# Patient Record
Sex: Female | Born: 1985
Health system: Southern US, Community
[De-identification: ages and names within clinical notes are randomized; demographics above are authoritative.]

## PROBLEM LIST (undated history)

## (undated) DIAGNOSIS — L500 Allergic urticaria: Secondary | ICD-10-CM

## (undated) DIAGNOSIS — I10 Essential (primary) hypertension: Secondary | ICD-10-CM

## (undated) DIAGNOSIS — F32A Depression, unspecified: Secondary | ICD-10-CM

## (undated) DIAGNOSIS — M797 Fibromyalgia: Secondary | ICD-10-CM

## (undated) DIAGNOSIS — F1010 Alcohol abuse, uncomplicated: Secondary | ICD-10-CM

## (undated) DIAGNOSIS — G43909 Migraine, unspecified, not intractable, without status migrainosus: Secondary | ICD-10-CM

## (undated) DIAGNOSIS — K219 Gastro-esophageal reflux disease without esophagitis: Secondary | ICD-10-CM

## (undated) DIAGNOSIS — R000 Tachycardia, unspecified: Secondary | ICD-10-CM

## (undated) DIAGNOSIS — Z87898 Personal history of other specified conditions: Secondary | ICD-10-CM

## (undated) DIAGNOSIS — F319 Bipolar disorder, unspecified: Secondary | ICD-10-CM

## (undated) DIAGNOSIS — F329 Major depressive disorder, single episode, unspecified: Secondary | ICD-10-CM

## (undated) DIAGNOSIS — F1190 Opioid use, unspecified, uncomplicated: Secondary | ICD-10-CM

## (undated) HISTORY — DX: Allergic urticaria: L50.0

## (undated) HISTORY — DX: Migraine, unspecified, not intractable, without status migrainosus: G43.909

## (undated) HISTORY — PX: CYSTECTOMY: SUR359

## (undated) HISTORY — DX: Fibromyalgia: M79.7

## (undated) HISTORY — DX: Depression, unspecified: F32.A

## (undated) HISTORY — DX: Essential (primary) hypertension: I10

## (undated) HISTORY — PX: MOLE REMOVAL: SHX2046

## (undated) HISTORY — DX: Tachycardia, unspecified: R00.0

## (undated) HISTORY — PX: LAPAROSCOPY: SHX197

## (undated) HISTORY — PX: TYMPANOSTOMY TUBE PLACEMENT: SHX32

## (undated) HISTORY — DX: Gastro-esophageal reflux disease without esophagitis: K21.9

---
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
## 2008-07-09 ENCOUNTER — Inpatient Hospital Stay (HOSPITAL_COMMUNITY): Admission: AD | Admit: 2008-07-09 | Discharge: 2008-07-09 | Payer: Self-pay | Admitting: Obstetrics and Gynecology

## 2008-07-16 ENCOUNTER — Encounter: Admission: RE | Admit: 2008-07-16 | Discharge: 2008-07-16 | Payer: Self-pay | Admitting: Obstetrics and Gynecology

## 2010-05-15 LAB — DIFFERENTIAL
Basophils Absolute: 0 10*3/uL (ref 0.0–0.1)
Basophils Relative: 0 % (ref 0–1)
Eosinophils Absolute: 0.1 10*3/uL (ref 0.0–0.7)
Eosinophils Relative: 2 % (ref 0–5)
Monocytes Absolute: 0.5 10*3/uL (ref 0.1–1.0)
Monocytes Relative: 10 % (ref 3–12)
Neutro Abs: 2.5 10*3/uL (ref 1.7–7.7)

## 2010-05-15 LAB — COMPREHENSIVE METABOLIC PANEL
ALT: 13 U/L (ref 0–35)
Albumin: 4.1 g/dL (ref 3.5–5.2)
Alkaline Phosphatase: 68 U/L (ref 39–117)
BUN: 9 mg/dL (ref 6–23)
Chloride: 105 mEq/L (ref 96–112)
Potassium: 3.8 mEq/L (ref 3.5–5.1)
Sodium: 137 mEq/L (ref 135–145)
Total Bilirubin: 0.3 mg/dL (ref 0.3–1.2)
Total Protein: 6.7 g/dL (ref 6.0–8.3)

## 2010-05-15 LAB — CBC
HCT: 36.5 % (ref 36.0–46.0)
Hemoglobin: 13.1 g/dL (ref 12.0–15.0)
Platelets: 234 10*3/uL (ref 150–400)
RDW: 12.5 % (ref 11.5–15.5)
WBC: 4.8 10*3/uL (ref 4.0–10.5)

## 2015-05-03 ENCOUNTER — Encounter: Payer: Self-pay | Admitting: Diagnostic Neuroimaging

## 2015-05-03 ENCOUNTER — Ambulatory Visit (INDEPENDENT_AMBULATORY_CARE_PROVIDER_SITE_OTHER): Payer: Medicaid Other | Admitting: Diagnostic Neuroimaging

## 2015-05-03 VITALS — BP 110/77 | HR 101 | Ht 60.0 in | Wt 118.4 lb

## 2015-05-03 DIAGNOSIS — M542 Cervicalgia: Secondary | ICD-10-CM | POA: Diagnosis not present

## 2015-05-03 DIAGNOSIS — R200 Anesthesia of skin: Secondary | ICD-10-CM

## 2015-05-03 DIAGNOSIS — R208 Other disturbances of skin sensation: Secondary | ICD-10-CM

## 2015-05-03 DIAGNOSIS — M79642 Pain in left hand: Secondary | ICD-10-CM

## 2015-05-03 DIAGNOSIS — M79641 Pain in right hand: Secondary | ICD-10-CM | POA: Diagnosis not present

## 2015-05-03 NOTE — Progress Notes (Signed)
[facility]  PATIENT: Madison Ryan DOB: March 03, 1985  REFERRING CLINICIAN: Osei-Bonsu HISTORY FROM: patient  REASON FOR VISIT: new consult    HISTORICAL  CHIEF COMPLAINT:  No chief complaint on file.   HISTORY OF PRESENT ILLNESS:   30 year old right-handed female with osteopenia, Raynaud's phenomenon, restless leg syndrome, GERD, gastric ulcers, thoracic compression fractures, endometriosis, migraine, depression, anxiety, fibromyalgia, here for evaluation of upper extremity numbness, tingling and weakness.  For past 2-3 months patient has had intermittent numbness and tingling pain on a daily basis, multiple times a day, 30-60 minutes a time, from her elbows down to her fingers. Digits 4 and 5 are mainly affected on both sides. Symptoms can switch sides. Symptoms seem to be worsened patient's arms are bent. Symptoms also worse when she is lying down.  Patient has also had consultation of other symptoms including numbness and tingling in the toes and fingers for past 2 years. She is also had diffuse pain in her neck, shoulders, arms, legs, back, spine since 2013. Patient was living in [place] at the time. She had extensive testing through her PCP, holistic doctor and then also a neurologist in [place] in 2015. Patient currently working with pain management doctor and psychiatrist for other issues as well.     REVIEW OF SYSTEMS: Full 14 system review of systems performed and negative with exception of: Insomnia sleepiness restless legs memory loss confusion headache numbness weakness dizziness tremor depression anxiety to much sleep disinterest in activities racing thoughts allergy skin sensitivity joint pain joint swelling cramps and diarrhea constipation from his of breath loss of vision eye pain and large lymph nodes fatigue chest pain palpitation swelling in legs rash birthmarks ringing in ears.  ALLERGIES: Allergies  Allergen Reactions  .  Cymbalta [Duloxetine Hcl] Other (See Comments)    Suicidal thoughts  . Prozac [Fluoxetine Hcl] Other (See Comments)    High anxiety, panic attacks    HOME MEDICATIONS: No outpatient prescriptions prior to visit.   No facility-administered medications prior to visit.    PAST MEDICAL HISTORY: Past Medical History  Diagnosis Date  . Tachycardia     PAST SURGICAL HISTORY: History reviewed. No pertinent past surgical history.  FAMILY HISTORY: Family History  Problem Relation Age of Onset  . Hypertension Mother   . Cancer Mother     breast  . Heart disease Mother   . COPD Mother   . Liver disease Mother   . Hyperthyroidism Sister   . Hypertension Maternal Grandmother   . Heart disease Maternal Grandmother   . COPD Maternal Grandmother     SOCIAL HISTORY:  Social History   Social History  . Marital Status: Single    Spouse Name: N/A  . Number of Children: 1  . Years of Education: 14   Occupational History  .      unemployed   Social History Main Topics  . Smoking status: Current Every Day Smoker -- 1.00 packs/day for 12 years  . Smokeless tobacco: Not on file  . Alcohol Use: Yes     Comment: 1 month maybe  . Drug Use: No  . Sexual Activity: Not on file   Other Topics Concern  . Not on file   Social History Narrative   Lives with sister , her family, son   Caffeine use- sodas, 1-2 daily     PHYSICAL EXAM  GENERAL EXAM/CONSTITUTIONAL: Vitals:  Filed Vitals:   05/03/15 1142  BP: 110/77  Pulse: 101  Height:  5' (1.524 m)  Weight: 118 lb 6.4 oz (53.706 kg)     Body mass index is 23.12 kg/(m^2).  Visual Acuity Screening   Right eye Left eye Both eyes  Without correction:     With correction: 20/30 20/30      Patient is in no distress; well developed, nourished and groomed; neck is supple  MALAISE APPEARANCE; TEARFUL  CARDIOVASCULAR:  Examination of carotid arteries is normal; no carotid bruits  Regular rate and rhythm, no  murmurs  Examination of peripheral vascular system by observation and palpation is normal  EYES:  Ophthalmoscopic exam of optic discs and posterior segments is normal; no papilledema or hemorrhages  MUSCULOSKELETAL:  Gait, strength, tone, movements noted in Neurologic exam below  NEUROLOGIC: MENTAL STATUS:  No flowsheet data found.  awake, alert, oriented to person, place and time  recent and remote memory intact  normal attention and concentration  language fluent, comprehension intact, naming intact,   fund of knowledge appropriate  CRANIAL NERVE:   2nd - no papilledema on fundoscopic exam  2nd, 3rd, 4th, 6th - pupils equal and reactive to light, visual fields full to confrontation, extraocular muscles intact, no nystagmus  5th - facial sensation symmetric  7th - facial strength symmetric  8th - hearing intact  9th - palate elevates symmetrically, uvula midline  11th - shoulder shrug symmetric  12th - tongue protrusion midline  MOTOR:   normal bulk and tone, DIFFUSE LIMITED STRENGTH 3-4 IN BUE AND BLE  SENSORY:   normal and symmetric to light touch, temperature, vibration  COORDINATION:   finger-nose-finger, fine finger movements normal  REFLEXES:   deep tendon reflexes TRACE and symmetric  GAIT/STATION:   narrow based gait; SLOW, ANTALGIC GAIT AND MOVEMENTS    DIAGNOSTIC DATA (LABS, IMAGING, TESTING) - I reviewed patient records, labs, notes, testing and imaging myself where available.  Lab Results  Component Value Date   WBC 4.8 07/09/2008   HGB 13.1 07/09/2008   HCT 36.5 07/09/2008   MCV 91.8 07/09/2008   PLT 234 07/09/2008      Component Value Date/Time   NA 137 07/09/2008 1809   K 3.8 07/09/2008 1809   CL 105 07/09/2008 1809   CO2 27 07/09/2008 1809   GLUCOSE 81 07/09/2008 1809   BUN 9 07/09/2008 1809   CREATININE 0.63 07/09/2008 1809   CALCIUM 9.2 07/09/2008 1809   PROT 6.7 07/09/2008 1809   ALBUMIN 4.1 07/09/2008 1809    AST 18 07/09/2008 1809   ALT 13 07/09/2008 1809   ALKPHOS 68 07/09/2008 1809   BILITOT 0.3 07/09/2008 1809   GFRNONAA >60 07/09/2008 1809   GFRAA  07/09/2008 1809    >60        The eGFR has been calculated using the MDRD equation. This calculation has not been validated in all clinical situations. eGFR's persistently <60 mL/min signify possible Chronic Kidney Disease.   No results found for: CHOL, HDL, LDLCALC, LDLDIRECT, TRIG, CHOLHDL No results found for: HGBA1C No results found for: VITAMINB12 No results found for: TSH   09/21/14 u/s abdomen - Surgical absent gallbladder.  Normal CBD.  Unremarkable liver.     ASSESSMENT AND PLAN  30 y.o. year old female here with constellation of symptoms including diffuse pain, diffuse weakness, now with numbness and tingling in upper extremities for past to 3 months. We'll check MRI brain, cervical spine. We'll also check EMG nerve conduction study. Patient also has chronic pain symptoms and chronic mood disorder issues.  Ddx:  CNS autoimmune/inflamm, metabolic, pain syndrome, mood disorder, peripheral neuropathy, cervical radiculopathy  1. Bilateral hand pain   2. Hand numbness   3. Neck pain      PLAN:  Orders Placed This Encounter  Procedures  . MR Brain W Wo Contrast  . MR Cervical Spine W Wo Contrast  . NCV with EMG(electromyography)   Return for for NCV/EMG.    Penni Bombard, MD 3/81/0175, 10:25 PM Certified in Neurology, Neurophysiology and Neuroimaging  [facility] [address] [place], [place] [address] [phone]

## 2015-05-03 NOTE — Patient Instructions (Signed)
Thank you for coming to see [place] at [facility]. I hope we have been able to provide you high quality care today.  You may receive a patient satisfaction survey over the next few weeks. We would appreciate your feedback and comments so that we may continue to improve ourselves and the health of our patients.  - I will check MRI brain and cervical spine - I will check EMG/NCS (electrical nerve testing)   ~~~~~~~~~~~~~~~~~~~~~~~~~~~~~~~~~~~~~~~~~~~~~~~~~~~~~~~~~~~~~~~~~  DR. Bionca Mckey'S GUIDE TO HAPPY AND HEALTHY LIVING These are some of my general health and wellness recommendations. Some of them may apply to you better than others. Please use common sense as you try these suggestions and feel free to ask me any questions.   ACTIVITY/FITNESS Mental, social, emotional and physical stimulation are very important for brain and body health. Try learning a new activity (arts, music, language, sports, games).  Keep moving your body to the best of your abilities. You can do this at home, inside or outside, the park, community center, gym or anywhere you like. Consider a physical therapist or personal trainer to get started. Consider the app Sworkit. Fitness trackers such as smart-watches, smart-phones or Fitbits can help as well.   NUTRITION Eat more plants: colorful vegetables, nuts, seeds and berries.  Eat less sugar, salt, preservatives and processed foods.  Avoid toxins such as cigarettes and alcohol.  Drink water when you are thirsty. Warm water with a slice of lemon is an excellent morning drink to start the day.  Consider these websites for more information The Nutrition Source (https://www.henry-hernandez.biz/) Precision Nutrition (WindowBlog.ch)   RELAXATION Consider practicing mindfulness meditation or other relaxation techniques such as deep breathing, prayer, yoga, tai chi, massage. See website mindful.org or the apps  Headspace or Calm to help get started.   SLEEP Try to get at least 7-8+ hours sleep per day. Regular exercise and reduced caffeine will help you sleep better. Practice good sleep hygeine techniques. See website sleep.org for more information.   PLANNING Prepare estate planning, living will, healthcare POA documents. Sometimes this is best planned with the help of an attorney. Theconversationproject.org and agingwithdignity.org are excellent resources.

## 2015-05-09 ENCOUNTER — Telehealth: Payer: Self-pay | Admitting: Diagnostic Neuroimaging

## 2015-05-09 ENCOUNTER — Inpatient Hospital Stay: Admission: RE | Admit: 2015-05-09 | Payer: Self-pay | Source: Ambulatory Visit

## 2015-05-09 ENCOUNTER — Other Ambulatory Visit: Payer: Self-pay

## 2015-05-09 NOTE — Telephone Encounter (Signed)
Patient is calling to see if her insurance Medicaid has approved her cervical spine and brain MRIs. Call transferred to [facility].

## 2015-05-12 NOTE — Telephone Encounter (Signed)
-----   Message from Blenda Peals sent at 05/09/2015  4:26 PM EDT ----- Regarding: MRI BRAIN & C-SPINE  Dr. Leta Baptist,  Did you receive the email I forward to you last Friday from [place] in regards to [employer] needing more information to approve this request?  The patient is scheduled to have her scans done this Friday @ [facility].  She has called 4 times since last Friday in regards to her auth. She states that her symptoms are getting worse.  You can call [employer]# [id number]  [id number]   Please advise with auth or on how to proceed.  Thank you!

## 2015-05-12 NOTE — Telephone Encounter (Signed)
I called [employer] for peer-peer review. Exams are approved.   Auth number: [id number]  -VRP

## 2015-05-12 NOTE — Telephone Encounter (Signed)
Pt called to speak with Seth Bake. I advised her Seth Bake was out of the office until Monday. She asked that a message be sent to Dr Leta Baptist. She said Dr Leta Baptist has to call the insurance company ,she doesn't know why but that is what she has been told by Seth Bake and GI. Sts her appt for MRI is tomorrow 05/13/15 at 5pm and this has not been authorized. Pt sts she has called everyday this week. Please call pt

## 2015-05-13 ENCOUNTER — Ambulatory Visit
Admission: RE | Admit: 2015-05-13 | Discharge: 2015-05-13 | Disposition: A | Discharge status: Home / Self Care | Payer: Self-pay | Source: Ambulatory Visit | Attending: Diagnostic Neuroimaging | Admitting: Diagnostic Neuroimaging

## 2015-05-13 ENCOUNTER — Ambulatory Visit
Admission: RE | Admit: 2015-05-13 | Discharge: 2015-05-13 | Disposition: A | Discharge status: Home / Self Care | Payer: Medicaid Other | Source: Ambulatory Visit | Attending: Diagnostic Neuroimaging | Admitting: Diagnostic Neuroimaging

## 2015-05-13 DIAGNOSIS — R208 Other disturbances of skin sensation: Secondary | ICD-10-CM

## 2015-05-13 DIAGNOSIS — M79642 Pain in left hand: Principal | ICD-10-CM

## 2015-05-13 DIAGNOSIS — R200 Anesthesia of skin: Secondary | ICD-10-CM

## 2015-05-13 DIAGNOSIS — M542 Cervicalgia: Secondary | ICD-10-CM | POA: Diagnosis not present

## 2015-05-13 DIAGNOSIS — M79641 Pain in right hand: Secondary | ICD-10-CM | POA: Diagnosis not present

## 2015-05-13 MED ORDER — GADOBENATE DIMEGLUMINE 529 MG/ML IV SOLN
10.0000 mL | Freq: Once | INTRAVENOUS | Status: AC | PRN
  Administered 2015-05-13: 10 mL via INTRAVENOUS

## 2015-05-13 NOTE — Telephone Encounter (Signed)
LVM informing patient her MRIs are approved, number put into appointments. Left name and number for questions; advised she must call before 12 noon today to reach RN.

## 2015-05-16 ENCOUNTER — Telehealth: Payer: Self-pay | Admitting: *Deleted

## 2015-05-16 NOTE — Telephone Encounter (Signed)
Per Dr Leta Baptist, spoke with patient and informed her that her MRI brain showed a few spots of scar tissue; her C spine is normal. Reminded her of EMG/NCS on 06/02/15. She verbalized understanding, appreciation.

## 2015-05-24 ENCOUNTER — Encounter: Payer: Medicaid Other | Admitting: Diagnostic Neuroimaging

## 2015-06-02 ENCOUNTER — Ambulatory Visit (INDEPENDENT_AMBULATORY_CARE_PROVIDER_SITE_OTHER): Payer: Medicaid Other | Admitting: Diagnostic Neuroimaging

## 2015-06-02 ENCOUNTER — Encounter (INDEPENDENT_AMBULATORY_CARE_PROVIDER_SITE_OTHER): Payer: Self-pay | Admitting: Diagnostic Neuroimaging

## 2015-06-02 ENCOUNTER — Encounter: Payer: Self-pay | Admitting: Diagnostic Neuroimaging

## 2015-06-02 DIAGNOSIS — M542 Cervicalgia: Secondary | ICD-10-CM

## 2015-06-02 DIAGNOSIS — M79641 Pain in right hand: Secondary | ICD-10-CM | POA: Diagnosis not present

## 2015-06-02 DIAGNOSIS — Z0289 Encounter for other administrative examinations: Secondary | ICD-10-CM

## 2015-06-02 DIAGNOSIS — M79642 Pain in left hand: Secondary | ICD-10-CM | POA: Diagnosis not present

## 2015-06-02 DIAGNOSIS — R200 Anesthesia of skin: Secondary | ICD-10-CM

## 2015-06-02 MED ORDER — RIZATRIPTAN BENZOATE 10 MG PO TBDP: 10.0000 mg | ORAL_TABLET | ORAL | Status: DC | PRN

## 2015-06-02 NOTE — Procedures (Signed)
   [facility]  NCS (NERVE CONDUCTION STUDY) WITH EMG (ELECTROMYOGRAPHY) REPORT   STUDY DATE: 06/02/15 PATIENT NAME: [name] DOB: 29-Dec-1985 MRN: [id number]  ORDERING CLINICIAN: Andrey Spearman, MD   TECHNOLOGIST: Laretta Alstrom  ELECTROMYOGRAPHER: Earlean Polka. Terreon Ekholm, MD  CLINICAL INFORMATION: 30 year old female with upper extremity pain and numbness.  FINDINGS: NERVE CONDUCTION STUDY: Bilateral median and ulnar motor responses and F wave latencies are normal. Bilateral median and ulnar sensory responses are normal.  NEEDLE ELECTROMYOGRAPHY: Needle examination of right upper extremity deltoid, biceps, flexor carpi radialis muscles is normal.   IMPRESSION:  This is a normal study. No electrodiagnostic evidence of large fiber neuropathy or myopathy at this time.    INTERPRETING PHYSICIAN:  Penni Bombard, MD Certified in Neurology, Neurophysiology and Neuroimaging  [facility] [address] [place], [place] [address] [phone]

## 2015-06-03 ENCOUNTER — Telehealth: Payer: Self-pay | Admitting: Diagnostic Neuroimaging

## 2015-06-03 NOTE — Telephone Encounter (Signed)
Patient called to advise insurance needs prior approval for migraine medication, doesn't remember the name of it.

## 2015-06-06 NOTE — Telephone Encounter (Signed)
Spoke with patient and informed her that this RN didn't have any information in Dr [employer] office note regarding her migraines. She stated she has tried, failed Imitrex, but she could not recall any other migraine meds she has tried/failed. Informed her that this may not be enough information to get PA, but this RN will call her insurance. Patient sounded tearful,  stating she has so many health issues and can't get her other medications either. She inquired as to why her MRI results wouldn't "be enough" to get her the medication. She stated she is very frustrated. This RN assured her she will call and do her best to get PA for medication, and will call her back and let her know outcome. Patient apologized for her frustration and verbalized understanding.  Spoke with [place], Medicaid, [place], [phone], who stated that patient picked up [place] 9 tabs on 05/13/15, therefore she will only be able to get 3 more tabs until 30 day time limit is up which is on 06/13/15.  Medicaid allows up to 12 tabs per 30 days of triptans.  She stated that Dr Gladstone Lighter prescription quantity does not require PA, but because patient has already received 9 tabs, she may only get 3 more until 06/13/15. At that time she may get full refill quantity.  Called patient and informed her of above conversation with [place], [employer].  She repeated information correctly, apologized again and verbalized understanding and appreciation for assistance.

## 2015-08-02 ENCOUNTER — Encounter: Payer: Self-pay | Admitting: Diagnostic Neuroimaging

## 2015-08-02 ENCOUNTER — Ambulatory Visit: Payer: Medicaid Other | Admitting: Diagnostic Neuroimaging

## 2015-12-15 ENCOUNTER — Telehealth: Payer: Self-pay | Admitting: *Deleted

## 2015-12-15 NOTE — Telephone Encounter (Signed)
Received PA request for rizatriptan on cover my meds. Called [facility]; spoke with Tiffany who gave pending PA # [id number].  She stated pt will not be able to get med until PA approved as pharmacy is submitting claim.  She stated that pt should be able to get refill of 9 tabs/month without a PA, so she advised this RN call pharmacy.  Called [facility], spoke with Mickel Baas who stated patient told pharmacy she "accidentally threw away her rizatriptan", so she  asked for another refill. Mickel Baas stated she is requesting a refill 18 days too early. Madison Ryan the patient was a 'no show' for her FU, last seen April. Madison Ryan to have patient call office and schedule follow up.  Mickel Baas verbalized understanding.

## 2016-03-14 ENCOUNTER — Ambulatory Visit: Payer: Medicaid Other | Admitting: Allergy and Immunology

## 2016-03-28 ENCOUNTER — Ambulatory Visit: Payer: Medicaid Other | Admitting: Allergy and Immunology

## 2016-04-05 ENCOUNTER — Encounter: Payer: Self-pay | Admitting: Allergy and Immunology

## 2016-04-05 ENCOUNTER — Ambulatory Visit (INDEPENDENT_AMBULATORY_CARE_PROVIDER_SITE_OTHER): Payer: Medicaid Other | Admitting: Allergy and Immunology

## 2016-04-05 VITALS — BP 104/88 | HR 100 | Temp 98.1°F | Resp 20 | Ht 61.0 in | Wt 101.6 lb

## 2016-04-05 DIAGNOSIS — R0600 Dyspnea, unspecified: Secondary | ICD-10-CM | POA: Diagnosis not present

## 2016-04-05 DIAGNOSIS — L500 Allergic urticaria: Secondary | ICD-10-CM | POA: Diagnosis not present

## 2016-04-05 DIAGNOSIS — J3089 Other allergic rhinitis: Secondary | ICD-10-CM

## 2016-04-05 HISTORY — DX: Allergic urticaria: L50.0

## 2016-04-05 MED ORDER — ALBUTEROL SULFATE HFA 108 (90 BASE) MCG/ACT IN AERS: 2.0000 | INHALATION_SPRAY | Freq: Four times a day (QID) | RESPIRATORY_TRACT | 1 refills | Status: DC | PRN

## 2016-04-05 MED ORDER — FLUTICASONE PROPIONATE 50 MCG/ACT NA SUSP: 2.0000 | Freq: Every day | NASAL | 5 refills | Status: AC

## 2016-04-05 MED ORDER — LEVOCETIRIZINE DIHYDROCHLORIDE 5 MG PO TABS: 5.0000 mg | ORAL_TABLET | Freq: Every evening | ORAL | 5 refills | Status: DC

## 2016-04-05 NOTE — Assessment & Plan Note (Signed)
   Aeroallergen avoidance measures have been discussed and provided in written form.  A prescription has been provided for levocetirizine (as above).  A prescription has been provided for fluticasone nasal spray, 2 sprays per nostril daily as needed. Proper nasal spray technique has been discussed and demonstrated.  I have also recommended nasal saline spray (i.e. Simply Saline) as needed prior to medicated nasal sprays.

## 2016-04-05 NOTE — Assessment & Plan Note (Addendum)
Unclear etiology. Skin tests to select food allergens were negative except for borderline positive results to waste her lobster.  However, the patient reports that she is able to eat shellfish without adverse symptoms and her symptoms do not correlate with the consumption of shellfish, therefore these represent false positive results.  NSAIDs and emotional stress commonly exacerbate urticaria but are not the underlying etiology in this case. There are no concomitant symptoms concerning for anaphylaxis or constitutional symptoms worrisome for an underlying malignancy. We will rule out other potential etiologies with labs. For symptom relief, patient is to take oral antihistamines as directed.  The following labs have been ordered: FCeRI antibody, TSH, anti-thyroglobulin antibody, thyroid peroxidase antibody, tryptase, CBC, CMP, ESR, ANA, and galactose-alpha-1,3-galactose IgE level.  The patient will be called with further recommendations after lab results have returned.  Instructions have been discussed and provided for H1/H2 receptor blockade with titration to find lowest effective dose.  A prescription has been provided for levocetirizine, 76m daily as needed.  A journal is to be kept recording any foods eaten, beverages consumed, medications taken within a 6 hour period prior to the onset of symptoms, as well as record activities being performed, and environmental conditions. For any symptoms concerning for anaphylaxis, 911 is to be called immediately.

## 2016-04-05 NOTE — Progress Notes (Addendum)
New Patient Note  RE: Madison Ryan MRN: [id number] DOB: 1985-09-16 Date of Office Visit: 04/05/2016  Referring provider: Benito Mccreedy, MD Primary care provider: Trey Sailors, PA  Chief Complaint: Allergic Reaction and Urticaria   History of present illness: Madison Ryan is a 31 y.o. female seen today in consultation requested by Benito Mccreedy, MD.  Over the past 4 months, Madison Ryan has experienced recurrent episodes of hives. The hives have appeared at different times over her head, neck, chest, back, arms and legs.  The lesions are described as erythematous, raised, at times linear, and pruritic.  Individual hives last less than 24 hours without leaving residual pigmentation or bruising. She denies concomitant angioedema, cardiopulmonary symptoms and GI symptoms. She has not experienced unexpected weight loss, recurrent fevers or drenching night sweats. She does admit to frequent joint pain which she attributes to fibromyalgia. No specific medication, food, skin care product, detergent, soap, or other environmental triggers have been identified. The symptoms do not seem to correlate with NSAIDs use or emotional stress. She did have clostridium difficile at the time of symptom onset. Madison Ryan has tried to control symptoms with systemic steroids and OTC antihistamines which have offered mild temporary relief of symptoms. She has not been evaluated and treated in the emergency department for these symptoms. Skin biopsy has not been performed. Recently, she has been experiencing hives almost daily.  Madison Ryan states that when she is exposed to very hot weather very cold weather she experiences dyspnea and chest tightness.  The lower respiratory symptoms seem to resolve once she is in an environment more moderate temperature.  She experiences nasal congestion, rhinorrhea, sneezing, and postnasal drainage. No significant seasonal symptom variation has been noted nor have specific environmental triggers been  identified.   Assessment and plan: Recurrent urticaria Unclear etiology. Skin tests to select food allergens were negative except for borderline positive results to waste her lobster.  However, the patient reports that she is able to eat shellfish without adverse symptoms and her symptoms do not correlate with the consumption of shellfish, therefore these represent false positive results.  NSAIDs and emotional stress commonly exacerbate urticaria but are not the underlying etiology in this case. There are no concomitant symptoms concerning for anaphylaxis or constitutional symptoms worrisome for an underlying malignancy. We will rule out other potential etiologies with labs. For symptom relief, patient is to take oral antihistamines as directed.  The following labs have been ordered: FCeRI antibody, TSH, anti-thyroglobulin antibody, thyroid peroxidase antibody, tryptase, CBC, CMP, ESR, ANA, and galactose-alpha-1,3-galactose IgE level.  The patient will be called with further recommendations after lab results have returned.  Instructions have been discussed and provided for H1/H2 receptor blockade with titration to find lowest effective dose.  A prescription has been provided for levocetirizine, 82m daily as needed.  A journal is to be kept recording any foods eaten, beverages consumed, medications taken within a 6 hour period prior to the onset of symptoms, as well as record activities being performed, and environmental conditions. For any symptoms concerning for anaphylaxis, 911 is to be called immediately.  Dyspnea  Tobacco cessation has been encouraged.  A prescription has been provided for albuterol HFA, 1-2 inhalations every 4-6 hours as needed.  Subjective and objective measures of pulmonary function will be followed and the treatment plan will be adjusted accordingly.  Other allergic rhinitis  Aeroallergen avoidance measures have been discussed and provided in written form.  A  prescription has been provided for levocetirizine (as above).  A prescription has been provided for fluticasone nasal spray, 2 sprays per nostril daily as needed. Proper nasal spray technique has been discussed and demonstrated.  I have also recommended nasal saline spray (i.e. Simply Saline) as needed prior to medicated nasal sprays.   Meds ordered this encounter  Medications  . levocetirizine (XYZAL) 5 MG tablet    Sig: Take 1 tablet (5 mg total) by mouth every evening.    Dispense:  34 tablet    Refill:  5  . albuterol (PROVENTIL HFA;VENTOLIN HFA) 108 (90 Base) MCG/ACT inhaler    Sig: Inhale 2 puffs into the lungs every 6 (six) hours as needed for wheezing or shortness of breath.    Dispense:  1 Inhaler    Refill:  1  . fluticasone (FLONASE) 50 MCG/ACT nasal spray    Sig: Place 2 sprays into both nostrils daily.    Dispense:  16 g    Refill:  5    Diagnostics: Spirometry:  Normal with an FEV1 of 3.52 L.  Please see scanned spirometry results for details. Environmental skin testing: Positive to molds and cockroach antigen. Food allergen skin testing: Borderline positive to waste her lobster.    Physical examination: Blood pressure 104/88, pulse 100, temperature 98.1 F (36.7 C), temperature source Oral, resp. rate 20, height '5\' 1"'  (1.549 m), weight 101 lb 10.1 oz (46.1 kg), SpO2 96 %.  General: Alert, interactive, in no acute distress. HEENT: TMs pearly gray, turbinates mildly edematous without discharge, post-pharynx mildly erythematous. Neck: Supple without lymphadenopathy. Lungs: Clear to auscultation without wheezing, rhonchi or rales. CV: Normal S1, S2 without murmurs. Abdomen: Nondistended, nontender. Skin: Scattered erythematous urticarial type lesions primarily located upper chest, neck , nonvesicular. Extremities:  No clubbing, cyanosis or edema. Neuro:   Grossly intact.  Review of systems:  Review of systems negative except as noted in HPI / PMHx or noted  below: Review of Systems  Constitutional: Negative.   HENT: Negative.   Eyes: Negative.   Respiratory: Negative.   Cardiovascular: Negative.   Gastrointestinal: Negative.   Genitourinary: Negative.   Musculoskeletal: Negative.   Skin: Negative.   Neurological: Negative.   Endo/Heme/Allergies: Negative.   Psychiatric/Behavioral: Negative.     Past medical history:  Past Medical History:  Diagnosis Date  . Recurrent urticaria 04/05/2016  . Tachycardia     Past surgical history:  Past Surgical History:  Procedure Laterality Date  . CYSTECTOMY    . LAPAROSCOPY    . MOLE REMOVAL    . TYMPANOSTOMY TUBE PLACEMENT      Family history: Family History  Problem Relation Age of Onset  . Hypertension Mother   . Cancer Mother     breast  . Heart disease Mother   . COPD Mother   . Liver disease Mother   . Hyperthyroidism Sister   . Hypertension Maternal Grandmother   . Heart disease Maternal Grandmother   . COPD Maternal Grandmother     Social history: Social History   Social History  . Marital status: Single    Spouse name: N/A  . Number of children: 1  . Years of education: 70   Occupational History  .      unemployed   Social History Main Topics  . Smoking status: Current Every Day Smoker    Packs/day: 1.00    Years: 12.00  . Smokeless tobacco: Never Used  . Alcohol use Yes     Comment: 1 month maybe  . Drug use: No  .  Sexual activity: Not on file   Other Topics Concern  . Not on file   Social History Narrative   Lives with sister , her family, son   Caffeine use- sodas, 1-2 daily   Environmental History: The patient lives in a house built in the [place] with vinyl floors throughout, gas heat, and window air conditioning units.  She has no pets.  She smokes one pack of cigarettes per day on average and started in 2004.  Allergies as of 04/05/2016      Reactions   Cymbalta [duloxetine Hcl] Other (See Comments)   Suicidal thoughts, pt. Is currently taking  it   Prozac [fluoxetine Hcl] Other (See Comments)   High anxiety, panic attacks      Medication List       Accurate as of 04/05/16  5:56 PM. Always use your most recent med list.          Calcium Carb-Ergocalciferol 500-200 MG-UNIT Tabs Take by mouth.   Calcium Carb-Ergocalciferol 500-200 MG-UNIT Tabs Take by mouth.   clonazePAM 0.5 MG tablet Commonly known as:  KLONOPIN 1 mg daily. As needed   fluticasone 50 MCG/ACT nasal spray Commonly known as:  FLONASE Place 2 sprays into both nostrils daily.   HYDROcodone-acetaminophen 5-325 MG tablet Commonly known as:  NORCO/VICODIN   HYDROcodone-acetaminophen 10-325 MG tablet Commonly known as:  NORCO TAKE 1 TABLET 3 TIMES A DAY AS NEEDED   levocetirizine 5 MG tablet Commonly known as:  XYZAL Take 1 tablet (5 mg total) by mouth every evening.   lubiprostone 24 MCG capsule Commonly known as:  AMITIZA TAKE ONE CAPSULE BY MOUTH TWICE A DAY AS NEEDED FOR 30 DAYS   LYRICA 200 MG capsule Generic drug:  pregabalin Take 200 mg by mouth 3 (three) times daily.   mometasone 50 MCG/ACT nasal spray Commonly known as:  NASONEX USE 2 SPRAY IN EACH NOSTRIL ONCE DAILY   montelukast 10 MG tablet Commonly known as:  SINGULAIR Take 10 mg by mouth.   norgestrel-ethinyl estradiol 0.3-30 MG-MCG tablet Commonly known as:  LO/OVRAL,CRYSELLE Take by mouth.   Potassium 99 MG Tabs Take 99 mg by mouth.   PRISTIQ 50 MG 24 hr tablet Generic drug:  desvenlafaxine Take 50 mg by mouth. Reported on 05/03/2015   PROAIR HFA 108 (90 Base) MCG/ACT inhaler Generic drug:  albuterol INHALE 2 PUFFS 4 TIMES DAILY   albuterol 108 (90 Base) MCG/ACT inhaler Commonly known as:  PROVENTIL HFA;VENTOLIN HFA Inhale 2 puffs into the lungs every 6 (six) hours as needed for wheezing or shortness of breath.   progesterone 100 MG capsule Commonly known as:  PROMETRIUM Take 100 mg by mouth.   propranolol 20 MG tablet Commonly known as:  INDERAL Take 20 mg  by mouth. Takes 10 mg twice daily   Riboflavin 400 MG Tabs Take by mouth.   rizatriptan 10 MG disintegrating tablet Commonly known as:  MAXALT-MLT Take 1 tablet (10 mg total) by mouth as needed for migraine. May repeat in 2 hours if needed   SUMAtriptan 100 MG tablet Commonly known as:  IMITREX 100 mg. Take 1 tab at onset of headache, may repeat one time in 2 hrs if needed, MAX dose 2 tabs in 24 hrs   tiZANidine 2 MG tablet Commonly known as:  ZANAFLEX Take 2 mg by mouth.       Known medication allergies: Allergies  Allergen Reactions  . Cymbalta [Duloxetine Hcl] Other (See Comments)    Suicidal thoughts, pt. Is  currently taking it  . Prozac [Fluoxetine Hcl] Other (See Comments)    High anxiety, panic attacks    I appreciate the opportunity to take part in Madison Ryan's care. Please do not hesitate to contact me with questions.  Sincerely,   R. Edgar Frisk, MD

## 2016-04-05 NOTE — Assessment & Plan Note (Signed)
   Tobacco cessation has been encouraged.  A prescription has been provided for albuterol HFA, 1-2 inhalations every 4-6 hours as needed.  Subjective and objective measures of pulmonary function will be followed and the treatment plan will be adjusted accordingly.

## 2016-04-05 NOTE — Patient Instructions (Addendum)
Recurrent urticaria Unclear etiology. Skin tests to select food allergens were negative except for borderline positive results to waste her lobster.  However, the patient reports that she is able to eat shellfish without adverse symptoms and her symptoms do not correlate with the consumption of shellfish, therefore these represent false positive results.  NSAIDs and emotional stress commonly exacerbate urticaria but are not the underlying etiology in this case. There are no concomitant symptoms concerning for anaphylaxis or constitutional symptoms worrisome for an underlying malignancy. We will rule out other potential etiologies with labs. For symptom relief, patient is to take oral antihistamines as directed.  The following labs have been ordered: FCeRI antibody, TSH, anti-thyroglobulin antibody, thyroid peroxidase antibody, tryptase, CBC, CMP, ESR, ANA, and galactose-alpha-1,3-galactose IgE level.  The patient will be called with further recommendations after lab results have returned.  Instructions have been discussed and provided for H1/H2 receptor blockade with titration to find lowest effective dose.  A prescription has been provided for levocetirizine, 71m daily as needed.  A journal is to be kept recording any foods eaten, beverages consumed, medications taken within a 6 hour period prior to the onset of symptoms, as well as record activities being performed, and environmental conditions. For any symptoms concerning for anaphylaxis, 911 is to be called immediately.  Dyspnea  Tobacco cessation has been encouraged.  A prescription has been provided for albuterol HFA, 1-2 inhalations every 4-6 hours as needed.  Subjective and objective measures of pulmonary function will be followed and the treatment plan will be adjusted accordingly.  Other allergic rhinitis  Aeroallergen avoidance measures have been discussed and provided in written form.  A prescription has been provided for  levocetirizine (as above).  A prescription has been provided for fluticasone nasal spray, 2 sprays per nostril daily as needed. Proper nasal spray technique has been discussed and demonstrated.  I have also recommended nasal saline spray (i.e. Simply Saline) as needed prior to medicated nasal sprays.   When lab results have returned the patient will be called with further recommendations and follow up instructions.  Urticaria (Hives)  . Levocetirizine (Xyzal) 5 mg twice a day and ranitidine (Zantac) 150 mg twice a day. If no symptoms for 7-14 days then decrease to. . Levocetirizine (Xyzal) 5 mg twice a day and ranitidine (Zantac) 150 mg once a day.  If no symptoms for 7-14 days then decrease to. . Levocetirizine (Xyzal) 5 mg twice a day.  If no symptoms for 7-14 days then decrease to. . Levocetirizine (Xyzal) 5 mg once a day.  May use Benadryl (diphenhydramine) as needed for breakthrough symptoms       If symptoms return, then step up dosage  Control of Mold Allergen  Mold and fungi can grow on a variety of surfaces provided certain temperature and moisture conditions exist.  Outdoor molds grow on plants, decaying vegetation and soil.  The major outdoor mold, Alternaria and Cladosporium, are found in very high numbers during hot and dry conditions.  Generally, a late Summer - Fall peak is seen for common outdoor fungal spores.  Rain will temporarily lower outdoor mold spore count, but counts rise rapidly when the rainy period ends.  The most important indoor molds are Aspergillus and Penicillium.  Dark, humid and poorly ventilated basements are ideal sites for mold growth.  The next most common sites of mold growth are the bathroom and the kitchen.  Outdoor [employer]1. Use air conditioning and keep windows closed 2. Avoid exposure to decaying vegetation.  3. Avoid leaf raking. 4. Avoid grain handling. 5. Consider wearing a face mask if working in moldy areas.  Indoor Mold  Control 1. Maintain humidity below 50%. 2. Clean washable surfaces with 5% bleach solution. 3. Remove sources e.g. Contaminated carpets.  Control of Cockroach Allergen  Cockroach allergen has been identified as an important cause of acute attacks of asthma, especially in urban settings.  There are fifty-five species of cockroach that exist in the [place], however only three, the [place], [place] species produce allergen that can affect patients with Asthma.  Allergens can be obtained from fecal particles, egg casings and secretions from cockroaches.    1. Remove food sources. 2. Reduce access to water. 3. Seal access and entry points. 4. Spray runways with 0.5-1% Diazinon or Chlorpyrifos 5. Blow boric acid power under stoves and refrigerator. 6. Place bait stations (hydramethylnon) at feeding sites.

## 2016-04-10 ENCOUNTER — Other Ambulatory Visit: Payer: Self-pay | Admitting: Allergy

## 2016-04-10 ENCOUNTER — Telehealth: Payer: Self-pay | Admitting: Allergy

## 2016-04-10 MED ORDER — RANITIDINE HCL 150 MG PO CAPS: 150.0000 mg | ORAL_CAPSULE | Freq: Two times a day (BID) | ORAL | 1 refills | Status: AC

## 2016-04-10 NOTE — Telephone Encounter (Signed)
done

## 2016-04-13 LAB — COMPREHENSIVE METABOLIC PANEL
ALT: 32 IU/L (ref 0–32)
AST: 13 IU/L (ref 0–40)
Albumin/Globulin Ratio: 1.9 (ref 1.2–2.2)
Albumin: 4.7 g/dL (ref 3.5–5.5)
Alkaline Phosphatase: 95 IU/L (ref 39–117)
BUN/Creatinine Ratio: 17 (ref 9–23)
BUN: 13 mg/dL (ref 6–20)
Bilirubin Total: 1 mg/dL (ref 0.0–1.2)
CO2: 25 mmol/L (ref 18–29)
Calcium: 9.9 mg/dL (ref 8.7–10.2)
Chloride: 95 mmol/L — ABNORMAL LOW (ref 96–106)
Creatinine, Ser: 0.77 mg/dL (ref 0.57–1.00)
GFR calc Af Amer: 119 mL/min/{1.73_m2} (ref >59)
GFR calc non Af Amer: 103 mL/min/{1.73_m2} (ref >59)
Globulin, Total: 2.5 g/dL (ref 1.5–4.5)
Glucose: 84 mg/dL (ref 65–99)
Potassium: 4.7 mmol/L (ref 3.5–5.2)
Sodium: 138 mmol/L (ref 134–144)
Total Protein: 7.2 g/dL (ref 6.0–8.5)

## 2016-04-13 LAB — CBC WITH DIFFERENTIAL/PLATELET
Basophils Absolute: 0 10*3/uL (ref 0.0–0.2)
Basos: 0 %
EOS (ABSOLUTE): 0.1 10*3/uL (ref 0.0–0.4)
Eos: 1 %
Hematocrit: 39.3 % (ref 34.0–46.6)
Hemoglobin: 13.7 g/dL (ref 11.1–15.9)
Immature Grans (Abs): 0 10*3/uL (ref 0.0–0.1)
Immature Granulocytes: 0 %
Lymphocytes Absolute: 2.1 10*3/uL (ref 0.7–3.1)
Lymphs: 23 %
MCH: 31.7 pg (ref 26.6–33.0)
MCHC: 34.9 g/dL (ref 31.5–35.7)
MCV: 91 fL (ref 79–97)
Monocytes Absolute: 0.7 10*3/uL (ref 0.1–0.9)
Monocytes: 8 %
Neutrophils Absolute: 6 10*3/uL (ref 1.4–7.0)
Neutrophils: 68 %
Platelets: 290 10*3/uL (ref 150–379)
RBC: 4.32 x10E6/uL (ref 3.77–5.28)
RDW: 12.8 % (ref 12.3–15.4)
WBC: 8.8 10*3/uL (ref 3.4–10.8)

## 2016-04-13 LAB — SEDIMENTATION RATE: Sed Rate: 2 mm/hr (ref 0–32)

## 2016-04-13 LAB — TRYPTASE: Tryptase: 2.5 ug/L (ref 2.2–13.2)

## 2016-04-13 LAB — ALPHA-GAL PANEL
Alpha Gal IgE*: <0.1 kU/L (ref <0.35)
Beef (Bos spp) IgE: 0.19 kU/L (ref <0.35)
Class Interpretation: 0
Class Interpretation: 0
Lamb/Mutton (Ovis spp) IgE: <0.1 kU/L (ref <0.35)
Pork (Sus spp) IgE: <0.1 kU/L (ref <0.35)

## 2016-04-13 LAB — IGE: IgE (Immunoglobulin E), Serum: 76 IU/mL (ref 0–100)

## 2016-04-13 LAB — CHRONIC URTICARIA: cu index: 1.1 (ref <10)

## 2016-05-14 ENCOUNTER — Encounter (INDEPENDENT_AMBULATORY_CARE_PROVIDER_SITE_OTHER): Payer: Self-pay

## 2016-05-14 ENCOUNTER — Ambulatory Visit (INDEPENDENT_AMBULATORY_CARE_PROVIDER_SITE_OTHER): Payer: Medicaid Other | Admitting: Diagnostic Neuroimaging

## 2016-05-14 ENCOUNTER — Encounter: Payer: Self-pay | Admitting: Diagnostic Neuroimaging

## 2016-05-14 VITALS — BP 130/96 | HR 93 | Ht 61.0 in | Wt 104.0 lb

## 2016-05-14 DIAGNOSIS — R200 Anesthesia of skin: Secondary | ICD-10-CM

## 2016-05-14 DIAGNOSIS — M79642 Pain in left hand: Secondary | ICD-10-CM | POA: Diagnosis not present

## 2016-05-14 DIAGNOSIS — M79641 Pain in right hand: Secondary | ICD-10-CM

## 2016-05-14 DIAGNOSIS — M542 Cervicalgia: Secondary | ICD-10-CM

## 2016-05-14 DIAGNOSIS — G43009 Migraine without aura, not intractable, without status migrainosus: Secondary | ICD-10-CM | POA: Diagnosis not present

## 2016-05-14 MED ORDER — RIZATRIPTAN BENZOATE 10 MG PO TBDP: 10.0000 mg | ORAL_TABLET | ORAL | 11 refills | Status: DC | PRN

## 2016-05-14 NOTE — Progress Notes (Signed)
[facility]  PATIENT: Madison Ryan DOB: 1985-11-22  REFERRING CLINICIAN: Osei-Bonsu HISTORY FROM: patient  REASON FOR VISIT: new consult    HISTORICAL  CHIEF COMPLAINT:  Chief Complaint  Patient presents with  . Follow-up  . Neck Pain    still with neck pain  . Bilateral Hand numbness    numbness gone, joint still ache.   Continues with leg jerking,     HISTORY OF PRESENT ILLNESS:   UPDATE 05/14/16: Since last visit, sxs continue. Numbness, pain, fatigue, stress. Having ~10-15 days migraine per month (frontal, nausea, photophobia, pressure, throbbing headaches). Rizatriptan seems to help.   PRIOR HPI (05/03/15): 31 year old right-handed female with osteopenia, Raynaud's phenomenon, restless leg syndrome, GERD, gastric ulcers, thoracic compression fractures, endometriosis, migraine, depression, anxiety, fibromyalgia, here for evaluation of upper extremity numbness, tingling and weakness. For past 2-3 months patient has had intermittent numbness and tingling pain on a daily basis, multiple times a day, 30-60 minutes a time, from her elbows down to her fingers. Digits 4 and 5 are mainly affected on both sides. Symptoms can switch sides. Symptoms seem to be worsened patient's arms are bent. Symptoms also worse when she is lying down. Patient has also had consultation of other symptoms including numbness and tingling in the toes and fingers for past 2 years. She is also had diffuse pain in her neck, shoulders, arms, legs, back, spine since 2013. Patient was living in [place] at the time. She had extensive testing through her PCP, holistic doctor and then also a neurologist in [place] in 2015. Patient currently working with pain management doctor and psychiatrist for other issues as well.   REVIEW OF SYSTEMS: Full 14 system review of systems performed and negative with exception of: fatigue facial swelling hearing loss incontinence headches agitation  diarrhea speech diff tremors temp intolerance.    ALLERGIES: Allergies  Allergen Reactions  . Cymbalta [Duloxetine Hcl] Other (See Comments)    Suicidal thoughts, pt. Is currently taking it  . Prozac [Fluoxetine Hcl] Other (See Comments)    High anxiety, panic attacks    HOME MEDICATIONS: Outpatient Medications Prior to Visit  Medication Sig Dispense Refill  . albuterol (PROAIR HFA) 108 (90 Base) MCG/ACT inhaler INHALE 2 PUFFS 4 TIMES DAILY    . albuterol (PROVENTIL HFA;VENTOLIN HFA) 108 (90 Base) MCG/ACT inhaler Inhale 2 puffs into the lungs every 6 (six) hours as needed for wheezing or shortness of breath. 1 Inhaler 1  . Calcium Carb-Ergocalciferol 500-200 MG-UNIT TABS Take by mouth.    . Calcium Carb-Ergocalciferol 500-200 MG-UNIT TABS Take by mouth.    . fluticasone (FLONASE) 50 MCG/ACT nasal spray Place 2 sprays into both nostrils daily. 16 g 5  . HYDROcodone-acetaminophen (NORCO) 10-325 MG tablet TAKE 1 TABLET 3 TIMES A DAY AS NEEDED    . levocetirizine (XYZAL) 5 MG tablet Take 1 tablet (5 mg total) by mouth every evening. 34 tablet 5  . lubiprostone (AMITIZA) 24 MCG capsule TAKE ONE CAPSULE BY MOUTH TWICE A DAY AS NEEDED FOR 30 DAYS    . mometasone (NASONEX) 50 MCG/ACT nasal spray USE 2 SPRAY IN EACH NOSTRIL ONCE DAILY    . montelukast (SINGULAIR) 10 MG tablet Take 10 mg by mouth.    . norgestrel-ethinyl estradiol (LO/OVRAL,CRYSELLE) 0.3-30 MG-MCG tablet Take by mouth.    . Potassium 99 MG TABS Take 99 mg by mouth.    . pregabalin (LYRICA) 200 MG capsule Take 200 mg by mouth 3 (three) times daily.    Marland Kitchen  progesterone (PROMETRIUM) 100 MG capsule Take 100 mg by mouth.    . propranolol (INDERAL) 20 MG tablet Take 20 mg by mouth. Takes 10 mg twice daily    . ranitidine (ZANTAC) 150 MG capsule Take 1 capsule (150 mg total) by mouth 2 (two) times daily. 60 capsule 1  . Riboflavin 400 MG TABS Take by mouth.    . rizatriptan (MAXALT-MLT) 10 MG disintegrating tablet Take 1 tablet (10 mg  total) by mouth as needed for migraine. May repeat in 2 hours if needed 9 tablet 11  . tiZANidine (ZANAFLEX) 2 MG tablet Take 2 mg by mouth.    . desvenlafaxine (PRISTIQ) 50 MG 24 hr tablet Take 50 mg by mouth. Reported on 05/03/2015    . clonazePAM (KLONOPIN) 0.5 MG tablet 1 mg daily. As needed    . HYDROcodone-acetaminophen (NORCO/VICODIN) 5-325 MG tablet     . SUMAtriptan (IMITREX) 100 MG tablet 100 mg. Take 1 tab at onset of headache, may repeat one time in 2 hrs if needed, MAX dose 2 tabs in 24 hrs  2   No facility-administered medications prior to visit.     PAST MEDICAL HISTORY: Past Medical History:  Diagnosis Date  . Recurrent urticaria 04/05/2016  . Tachycardia     PAST SURGICAL HISTORY: Past Surgical History:  Procedure Laterality Date  . CYSTECTOMY    . LAPAROSCOPY    . MOLE REMOVAL    . TYMPANOSTOMY TUBE PLACEMENT      FAMILY HISTORY: Family History  Problem Relation Age of Onset  . Hypertension Mother   . Cancer Mother     breast  . Heart disease Mother   . COPD Mother   . Liver disease Mother   . Hyperthyroidism Sister   . Hypertension Maternal Grandmother   . Heart disease Maternal Grandmother   . COPD Maternal Grandmother     SOCIAL HISTORY:  Social History   Social History  . Marital status: Single    Spouse name: N/A  . Number of children: 1  . Years of education: 3   Occupational History  .      unemployed   Social History Main Topics  . Smoking status: Current Every Day Smoker    Packs/day: 1.00    Years: 12.00  . Smokeless tobacco: Never Used  . Alcohol use Yes     Comment: 1 month maybe  . Drug use: No  . Sexual activity: Not on file   Other Topics Concern  . Not on file   Social History Narrative   Lives with sister , her family, son   Caffeine use- sodas, 1-2 daily     PHYSICAL EXAM  GENERAL EXAM/CONSTITUTIONAL: Vitals:  Vitals:   05/14/16 1334  BP: (!) 130/96  Pulse: 93  Weight: 104 lb (47.2 kg)  Height: 5\' 1"   (1.549 m)   Body mass index is 19.65 kg/m. No exam data present  Patient is in no distress; well developed, nourished and groomed; neck is supple  MALAISE APPEARANCE; TEARFUL  CARDIOVASCULAR:  Examination of carotid arteries is normal; no carotid bruits  Regular rate and rhythm, no murmurs  Examination of peripheral vascular system by observation and palpation is normal  EYES:  Ophthalmoscopic exam of optic discs and posterior segments is normal; no papilledema or hemorrhages  MUSCULOSKELETAL:  Gait, strength, tone, movements noted in Neurologic exam below  NEUROLOGIC: MENTAL STATUS:  No flowsheet data found.  awake, alert, oriented to person, place and time  recent and  remote memory intact  normal attention and concentration  language fluent, comprehension intact, naming intact,   fund of knowledge appropriate  CRANIAL NERVE:   2nd - no papilledema on fundoscopic exam  2nd, 3rd, 4th, 6th - pupils equal and reactive to light, visual fields full to confrontation, extraocular muscles intact, no nystagmus  5th - facial sensation symmetric  7th - facial strength symmetric  8th - hearing intact  9th - palate elevates symmetrically, uvula midline  11th - shoulder shrug symmetric  12th - tongue protrusion midline  MOTOR:   normal bulk and tone, DIFFUSE LIMITED STRENGTH 3-4 IN BUE AND BLE  SENSORY:   normal and symmetric to light touch, temperature, vibration  COORDINATION:   finger-nose-finger, fine finger movements normal  REFLEXES:   deep tendon reflexes TRACE and symmetric  GAIT/STATION:   narrow based gait; SLOW, ANTALGIC GAIT AND MOVEMENTS    DIAGNOSTIC DATA (LABS, IMAGING, TESTING) - I reviewed patient records, labs, notes, testing and imaging myself where available.  Lab Results  Component Value Date   WBC 8.8 04/06/2016   HGB 13.1 07/09/2008   HCT 39.3 04/06/2016   MCV 91 04/06/2016   PLT 290 04/06/2016      Component Value  Date/Time   NA 138 04/06/2016 1133   K 4.7 04/06/2016 1133   CL 95 (L) 04/06/2016 1133   CO2 25 04/06/2016 1133   GLUCOSE 84 04/06/2016 1133   GLUCOSE 81 07/09/2008 1809   BUN 13 04/06/2016 1133   CREATININE 0.77 04/06/2016 1133   CALCIUM 9.9 04/06/2016 1133   PROT 7.2 04/06/2016 1133   ALBUMIN 4.7 04/06/2016 1133   AST 13 04/06/2016 1133   ALT 32 04/06/2016 1133   ALKPHOS 95 04/06/2016 1133   BILITOT 1.0 04/06/2016 1133   GFRNONAA 103 04/06/2016 1133   GFRAA 119 04/06/2016 1133   No results found for: CHOL, HDL, LDLCALC, LDLDIRECT, TRIG, CHOLHDL No results found for: HGBA1C No results found for: VITAMINB12 No results found for: TSH   09/21/14 u/s abdomen - Surgical absent gallbladder.  Normal CBD.  Unremarkable liver.  06/02/15 EMG/NCS - This is a normal study. No electrodiagnostic evidence of large fiber neuropathy or myopathy at this time.  05/13/15 MRI brain  1.   Scattered T2/FLAIR hyperintense foci in the subcortical and deep white matter of both hemispheres. This is a nonspecific finding and is more consistent with chronic microvascular ischemic changes or changes due to migraine. Demyelination or vasculitis would be less likely to give this pattern. 2.   Fluid within some of the mastoid air cells consistent with mild eustachian tube dysfunction 3.   There are no acute findings.  05/13/15 MRI cervical  - normal     ASSESSMENT AND PLAN  31 y.o. year old female here with constellation of symptoms including diffuse pain, diffuse weakness, now with numbness and tingling in upper extremities since early 2017. Patient also has chronic pain symptoms and chronic mood disorder issues.  Ddx: fibromyalgia, pain syndrome, mood disorder, autoimmune/inflamm, metabolic  1. Bilateral hand pain   2. Hand numbness   3. Neck pain   4. Migraine without aura and without status migrainosus, not intractable    Tried and failed: topiramate, sumatriptan   PLAN: - continue pain mgmt  and psychiatry/psychology treatments - agree with rheumatology and endocrinology consults per PCP - continue propranolol for tachycardia and migraine prevention (per PCP) - continue rizatriptan for migraine (per [facility])  Meds ordered this encounter  Medications  . rizatriptan (MAXALT-MLT)  10 MG disintegrating tablet    Sig: Take 1 tablet (10 mg total) by mouth as needed for migraine. May repeat in 2 hours if needed    Dispense:  9 tablet    Refill:  11   Return in about 3 months (around 08/13/2016).    Penni Bombard, MD 0/02/69, 2:19 PM Certified in Neurology, Neurophysiology and Neuroimaging  [facility] [address] [place], [place] [address] [phone]

## 2016-05-17 ENCOUNTER — Ambulatory Visit: Payer: Self-pay | Admitting: Allergy and Immunology

## 2016-06-12 ENCOUNTER — Other Ambulatory Visit: Payer: Self-pay | Admitting: Allergy and Immunology

## 2016-06-12 DIAGNOSIS — R0600 Dyspnea, unspecified: Secondary | ICD-10-CM

## 2016-06-28 ENCOUNTER — Telehealth: Payer: Self-pay | Admitting: *Deleted

## 2016-06-28 NOTE — Telephone Encounter (Signed)
Patient did not know that she had an appointment scheduled and wants to know if we can excuse her no show fee from April.

## 2016-06-28 NOTE — Telephone Encounter (Signed)
Adjusted no show fee - kt

## 2016-07-04 ENCOUNTER — Other Ambulatory Visit: Payer: Self-pay | Admitting: Internal Medicine

## 2016-07-04 DIAGNOSIS — N644 Mastodynia: Secondary | ICD-10-CM

## 2016-07-04 DIAGNOSIS — N63 Unspecified lump in unspecified breast: Secondary | ICD-10-CM

## 2016-07-05 ENCOUNTER — Other Ambulatory Visit: Payer: Self-pay | Admitting: Internal Medicine

## 2016-07-05 DIAGNOSIS — N644 Mastodynia: Secondary | ICD-10-CM

## 2016-07-05 DIAGNOSIS — N63 Unspecified lump in unspecified breast: Secondary | ICD-10-CM

## 2016-07-10 ENCOUNTER — Other Ambulatory Visit: Payer: Medicaid Other

## 2016-07-12 ENCOUNTER — Other Ambulatory Visit: Payer: Medicaid Other

## 2016-07-12 ENCOUNTER — Ambulatory Visit
Admission: RE | Admit: 2016-07-12 | Discharge: 2016-07-12 | Disposition: A | Discharge status: Home / Self Care | Payer: Medicaid Other | Source: Ambulatory Visit | Attending: Internal Medicine | Admitting: Internal Medicine

## 2016-07-12 DIAGNOSIS — N644 Mastodynia: Secondary | ICD-10-CM

## 2016-07-12 DIAGNOSIS — N63 Unspecified lump in unspecified breast: Secondary | ICD-10-CM

## 2016-08-28 ENCOUNTER — Encounter: Payer: Self-pay | Admitting: Diagnostic Neuroimaging

## 2016-08-28 ENCOUNTER — Ambulatory Visit: Payer: Medicaid Other | Admitting: Diagnostic Neuroimaging

## 2016-09-20 ENCOUNTER — Ambulatory Visit: Payer: Medicaid Other | Admitting: Allergy and Immunology

## 2017-02-21 DIAGNOSIS — G43909 Migraine, unspecified, not intractable, without status migrainosus: Secondary | ICD-10-CM | POA: Diagnosis not present

## 2017-02-21 DIAGNOSIS — J309 Allergic rhinitis, unspecified: Secondary | ICD-10-CM | POA: Diagnosis not present

## 2017-02-21 DIAGNOSIS — R1013 Epigastric pain: Secondary | ICD-10-CM | POA: Diagnosis not present

## 2017-02-21 DIAGNOSIS — F39 Unspecified mood [affective] disorder: Secondary | ICD-10-CM | POA: Diagnosis not present

## 2017-02-21 DIAGNOSIS — F418 Other specified anxiety disorders: Secondary | ICD-10-CM | POA: Diagnosis not present

## 2017-02-21 DIAGNOSIS — I10 Essential (primary) hypertension: Secondary | ICD-10-CM | POA: Diagnosis not present

## 2017-02-21 DIAGNOSIS — M797 Fibromyalgia: Secondary | ICD-10-CM | POA: Diagnosis not present

## 2017-02-21 DIAGNOSIS — G894 Chronic pain syndrome: Secondary | ICD-10-CM | POA: Diagnosis not present

## 2017-02-21 DIAGNOSIS — B009 Herpesviral infection, unspecified: Secondary | ICD-10-CM | POA: Diagnosis not present

## 2017-04-02 DIAGNOSIS — G8929 Other chronic pain: Secondary | ICD-10-CM | POA: Diagnosis not present

## 2017-04-02 DIAGNOSIS — M542 Cervicalgia: Secondary | ICD-10-CM | POA: Diagnosis not present

## 2017-04-02 DIAGNOSIS — M797 Fibromyalgia: Secondary | ICD-10-CM | POA: Diagnosis not present

## 2017-04-02 DIAGNOSIS — Z79891 Long term (current) use of opiate analgesic: Secondary | ICD-10-CM | POA: Diagnosis not present

## 2017-04-02 DIAGNOSIS — M545 Low back pain: Secondary | ICD-10-CM | POA: Diagnosis not present

## 2017-04-08 DIAGNOSIS — N760 Acute vaginitis: Secondary | ICD-10-CM | POA: Diagnosis not present

## 2017-04-08 DIAGNOSIS — Z124 Encounter for screening for malignant neoplasm of cervix: Secondary | ICD-10-CM | POA: Diagnosis not present

## 2017-04-08 DIAGNOSIS — N912 Amenorrhea, unspecified: Secondary | ICD-10-CM | POA: Diagnosis not present

## 2017-04-08 DIAGNOSIS — B9689 Other specified bacterial agents as the cause of diseases classified elsewhere: Secondary | ICD-10-CM | POA: Diagnosis not present

## 2017-04-08 DIAGNOSIS — R300 Dysuria: Secondary | ICD-10-CM | POA: Diagnosis not present

## 2017-04-08 DIAGNOSIS — Z01419 Encounter for gynecological examination (general) (routine) without abnormal findings: Secondary | ICD-10-CM | POA: Diagnosis not present

## 2017-04-08 DIAGNOSIS — Z113 Encounter for screening for infections with a predominantly sexual mode of transmission: Secondary | ICD-10-CM | POA: Diagnosis not present

## 2017-04-23 DIAGNOSIS — Z0000 Encounter for general adult medical examination without abnormal findings: Secondary | ICD-10-CM | POA: Diagnosis not present

## 2017-04-23 DIAGNOSIS — F418 Other specified anxiety disorders: Secondary | ICD-10-CM | POA: Diagnosis not present

## 2017-04-23 DIAGNOSIS — G43909 Migraine, unspecified, not intractable, without status migrainosus: Secondary | ICD-10-CM | POA: Diagnosis not present

## 2017-04-23 DIAGNOSIS — F39 Unspecified mood [affective] disorder: Secondary | ICD-10-CM | POA: Diagnosis not present

## 2017-04-23 DIAGNOSIS — M797 Fibromyalgia: Secondary | ICD-10-CM | POA: Diagnosis not present

## 2017-04-23 DIAGNOSIS — B009 Herpesviral infection, unspecified: Secondary | ICD-10-CM | POA: Diagnosis not present

## 2017-04-23 DIAGNOSIS — I10 Essential (primary) hypertension: Secondary | ICD-10-CM | POA: Diagnosis not present

## 2017-04-23 DIAGNOSIS — R1013 Epigastric pain: Secondary | ICD-10-CM | POA: Diagnosis not present

## 2017-04-23 DIAGNOSIS — J309 Allergic rhinitis, unspecified: Secondary | ICD-10-CM | POA: Diagnosis not present

## 2017-04-23 DIAGNOSIS — G894 Chronic pain syndrome: Secondary | ICD-10-CM | POA: Diagnosis not present

## 2017-04-30 DIAGNOSIS — M542 Cervicalgia: Secondary | ICD-10-CM | POA: Diagnosis not present

## 2017-04-30 DIAGNOSIS — M797 Fibromyalgia: Secondary | ICD-10-CM | POA: Diagnosis not present

## 2017-04-30 DIAGNOSIS — Z79891 Long term (current) use of opiate analgesic: Secondary | ICD-10-CM | POA: Diagnosis not present

## 2017-04-30 DIAGNOSIS — G8929 Other chronic pain: Secondary | ICD-10-CM | POA: Diagnosis not present

## 2017-04-30 DIAGNOSIS — M545 Low back pain: Secondary | ICD-10-CM | POA: Diagnosis not present

## 2017-05-01 DIAGNOSIS — R5382 Chronic fatigue, unspecified: Secondary | ICD-10-CM | POA: Diagnosis not present

## 2017-05-20 DIAGNOSIS — R5382 Chronic fatigue, unspecified: Secondary | ICD-10-CM | POA: Diagnosis not present

## 2017-05-28 DIAGNOSIS — M79642 Pain in left hand: Secondary | ICD-10-CM | POA: Diagnosis not present

## 2017-05-28 DIAGNOSIS — M797 Fibromyalgia: Secondary | ICD-10-CM | POA: Diagnosis not present

## 2017-05-28 DIAGNOSIS — R200 Anesthesia of skin: Secondary | ICD-10-CM | POA: Diagnosis not present

## 2017-05-28 DIAGNOSIS — M79641 Pain in right hand: Secondary | ICD-10-CM | POA: Diagnosis not present

## 2017-05-29 DIAGNOSIS — M545 Low back pain: Secondary | ICD-10-CM | POA: Diagnosis not present

## 2017-05-29 DIAGNOSIS — G8929 Other chronic pain: Secondary | ICD-10-CM | POA: Diagnosis not present

## 2017-05-29 DIAGNOSIS — Z79891 Long term (current) use of opiate analgesic: Secondary | ICD-10-CM | POA: Diagnosis not present

## 2017-05-29 DIAGNOSIS — M542 Cervicalgia: Secondary | ICD-10-CM | POA: Diagnosis not present

## 2017-05-29 DIAGNOSIS — M546 Pain in thoracic spine: Secondary | ICD-10-CM | POA: Diagnosis not present

## 2017-05-29 DIAGNOSIS — M8580 Other specified disorders of bone density and structure, unspecified site: Secondary | ICD-10-CM | POA: Diagnosis not present

## 2017-05-29 DIAGNOSIS — M797 Fibromyalgia: Secondary | ICD-10-CM | POA: Diagnosis not present

## 2017-06-04 DIAGNOSIS — I10 Essential (primary) hypertension: Secondary | ICD-10-CM | POA: Diagnosis not present

## 2017-06-04 DIAGNOSIS — G894 Chronic pain syndrome: Secondary | ICD-10-CM | POA: Diagnosis not present

## 2017-06-04 DIAGNOSIS — B009 Herpesviral infection, unspecified: Secondary | ICD-10-CM | POA: Diagnosis not present

## 2017-06-04 DIAGNOSIS — M797 Fibromyalgia: Secondary | ICD-10-CM | POA: Diagnosis not present

## 2017-06-04 DIAGNOSIS — R5382 Chronic fatigue, unspecified: Secondary | ICD-10-CM | POA: Diagnosis not present

## 2017-06-04 DIAGNOSIS — R1013 Epigastric pain: Secondary | ICD-10-CM | POA: Diagnosis not present

## 2017-06-04 DIAGNOSIS — F39 Unspecified mood [affective] disorder: Secondary | ICD-10-CM | POA: Diagnosis not present

## 2017-06-04 DIAGNOSIS — F418 Other specified anxiety disorders: Secondary | ICD-10-CM | POA: Diagnosis not present

## 2017-06-04 DIAGNOSIS — G43909 Migraine, unspecified, not intractable, without status migrainosus: Secondary | ICD-10-CM | POA: Diagnosis not present

## 2017-06-04 DIAGNOSIS — J309 Allergic rhinitis, unspecified: Secondary | ICD-10-CM | POA: Diagnosis not present

## 2017-06-11 DIAGNOSIS — G44319 Acute post-traumatic headache, not intractable: Secondary | ICD-10-CM | POA: Diagnosis not present

## 2017-06-11 DIAGNOSIS — S3992XA Unspecified injury of lower back, initial encounter: Secondary | ICD-10-CM | POA: Diagnosis not present

## 2017-06-11 DIAGNOSIS — M533 Sacrococcygeal disorders, not elsewhere classified: Secondary | ICD-10-CM | POA: Diagnosis not present

## 2017-06-11 DIAGNOSIS — R51 Headache: Secondary | ICD-10-CM | POA: Diagnosis not present

## 2017-06-11 DIAGNOSIS — T148XXA Other injury of unspecified body region, initial encounter: Secondary | ICD-10-CM | POA: Diagnosis not present

## 2017-06-11 DIAGNOSIS — S34109A Unspecified injury to unspecified level of lumbar spinal cord, initial encounter: Secondary | ICD-10-CM | POA: Diagnosis not present

## 2017-06-11 DIAGNOSIS — Y999 Unspecified external cause status: Secondary | ICD-10-CM | POA: Diagnosis not present

## 2017-06-11 DIAGNOSIS — S0990XA Unspecified injury of head, initial encounter: Secondary | ICD-10-CM | POA: Diagnosis not present

## 2017-06-11 DIAGNOSIS — W182XXA Fall in (into) shower or empty bathtub, initial encounter: Secondary | ICD-10-CM | POA: Diagnosis not present

## 2017-06-11 DIAGNOSIS — Z3202 Encounter for pregnancy test, result negative: Secondary | ICD-10-CM | POA: Diagnosis not present

## 2017-06-12 DIAGNOSIS — M545 Low back pain: Secondary | ICD-10-CM | POA: Diagnosis not present

## 2017-06-12 DIAGNOSIS — G8929 Other chronic pain: Secondary | ICD-10-CM | POA: Diagnosis not present

## 2017-06-12 DIAGNOSIS — Z79891 Long term (current) use of opiate analgesic: Secondary | ICD-10-CM | POA: Diagnosis not present

## 2017-06-12 DIAGNOSIS — M546 Pain in thoracic spine: Secondary | ICD-10-CM | POA: Diagnosis not present

## 2017-06-12 DIAGNOSIS — M542 Cervicalgia: Secondary | ICD-10-CM | POA: Diagnosis not present

## 2017-06-12 DIAGNOSIS — M797 Fibromyalgia: Secondary | ICD-10-CM | POA: Diagnosis not present

## 2017-06-12 DIAGNOSIS — S3992XA Unspecified injury of lower back, initial encounter: Secondary | ICD-10-CM | POA: Diagnosis not present

## 2017-06-12 DIAGNOSIS — M8580 Other specified disorders of bone density and structure, unspecified site: Secondary | ICD-10-CM | POA: Diagnosis not present

## 2017-06-12 DIAGNOSIS — R5382 Chronic fatigue, unspecified: Secondary | ICD-10-CM | POA: Diagnosis not present

## 2017-06-13 ENCOUNTER — Other Ambulatory Visit: Payer: Self-pay | Admitting: Diagnostic Neuroimaging

## 2017-06-18 DIAGNOSIS — G43909 Migraine, unspecified, not intractable, without status migrainosus: Secondary | ICD-10-CM | POA: Diagnosis not present

## 2017-06-18 DIAGNOSIS — J309 Allergic rhinitis, unspecified: Secondary | ICD-10-CM | POA: Diagnosis not present

## 2017-06-18 DIAGNOSIS — R1013 Epigastric pain: Secondary | ICD-10-CM | POA: Diagnosis not present

## 2017-06-18 DIAGNOSIS — G894 Chronic pain syndrome: Secondary | ICD-10-CM | POA: Diagnosis not present

## 2017-06-18 DIAGNOSIS — I10 Essential (primary) hypertension: Secondary | ICD-10-CM | POA: Diagnosis not present

## 2017-06-18 DIAGNOSIS — M797 Fibromyalgia: Secondary | ICD-10-CM | POA: Diagnosis not present

## 2017-06-18 DIAGNOSIS — Z720 Tobacco use: Secondary | ICD-10-CM | POA: Diagnosis not present

## 2017-06-18 DIAGNOSIS — F418 Other specified anxiety disorders: Secondary | ICD-10-CM | POA: Diagnosis not present

## 2017-06-18 DIAGNOSIS — B009 Herpesviral infection, unspecified: Secondary | ICD-10-CM | POA: Diagnosis not present

## 2017-06-18 DIAGNOSIS — F39 Unspecified mood [affective] disorder: Secondary | ICD-10-CM | POA: Diagnosis not present

## 2017-06-27 DIAGNOSIS — G8929 Other chronic pain: Secondary | ICD-10-CM | POA: Diagnosis not present

## 2017-06-27 DIAGNOSIS — Z1331 Encounter for screening for depression: Secondary | ICD-10-CM | POA: Diagnosis not present

## 2017-06-27 DIAGNOSIS — M8580 Other specified disorders of bone density and structure, unspecified site: Secondary | ICD-10-CM | POA: Diagnosis not present

## 2017-06-27 DIAGNOSIS — M546 Pain in thoracic spine: Secondary | ICD-10-CM | POA: Diagnosis not present

## 2017-06-27 DIAGNOSIS — M542 Cervicalgia: Secondary | ICD-10-CM | POA: Diagnosis not present

## 2017-06-27 DIAGNOSIS — Z79891 Long term (current) use of opiate analgesic: Secondary | ICD-10-CM | POA: Diagnosis not present

## 2017-06-27 DIAGNOSIS — M545 Low back pain: Secondary | ICD-10-CM | POA: Diagnosis not present

## 2017-06-27 DIAGNOSIS — M797 Fibromyalgia: Secondary | ICD-10-CM | POA: Diagnosis not present

## 2017-07-25 DIAGNOSIS — M542 Cervicalgia: Secondary | ICD-10-CM | POA: Diagnosis not present

## 2017-07-25 DIAGNOSIS — M8580 Other specified disorders of bone density and structure, unspecified site: Secondary | ICD-10-CM | POA: Diagnosis not present

## 2017-07-25 DIAGNOSIS — G8929 Other chronic pain: Secondary | ICD-10-CM | POA: Diagnosis not present

## 2017-07-25 DIAGNOSIS — M545 Low back pain: Secondary | ICD-10-CM | POA: Diagnosis not present

## 2017-07-25 DIAGNOSIS — M797 Fibromyalgia: Secondary | ICD-10-CM | POA: Diagnosis not present

## 2017-07-25 DIAGNOSIS — Z79891 Long term (current) use of opiate analgesic: Secondary | ICD-10-CM | POA: Diagnosis not present

## 2017-07-25 DIAGNOSIS — M546 Pain in thoracic spine: Secondary | ICD-10-CM | POA: Diagnosis not present

## 2017-08-06 DIAGNOSIS — G43909 Migraine, unspecified, not intractable, without status migrainosus: Secondary | ICD-10-CM | POA: Diagnosis not present

## 2017-08-06 DIAGNOSIS — G894 Chronic pain syndrome: Secondary | ICD-10-CM | POA: Diagnosis not present

## 2017-08-06 DIAGNOSIS — F39 Unspecified mood [affective] disorder: Secondary | ICD-10-CM | POA: Diagnosis not present

## 2017-08-06 DIAGNOSIS — M797 Fibromyalgia: Secondary | ICD-10-CM | POA: Diagnosis not present

## 2017-08-06 DIAGNOSIS — I10 Essential (primary) hypertension: Secondary | ICD-10-CM | POA: Diagnosis not present

## 2017-08-06 DIAGNOSIS — J309 Allergic rhinitis, unspecified: Secondary | ICD-10-CM | POA: Diagnosis not present

## 2017-08-06 DIAGNOSIS — B009 Herpesviral infection, unspecified: Secondary | ICD-10-CM | POA: Diagnosis not present

## 2017-08-06 DIAGNOSIS — Z720 Tobacco use: Secondary | ICD-10-CM | POA: Diagnosis not present

## 2017-08-06 DIAGNOSIS — R1013 Epigastric pain: Secondary | ICD-10-CM | POA: Diagnosis not present

## 2017-08-06 DIAGNOSIS — F418 Other specified anxiety disorders: Secondary | ICD-10-CM | POA: Diagnosis not present

## 2017-08-20 DIAGNOSIS — F39 Unspecified mood [affective] disorder: Secondary | ICD-10-CM | POA: Diagnosis not present

## 2017-08-20 DIAGNOSIS — I10 Essential (primary) hypertension: Secondary | ICD-10-CM | POA: Diagnosis not present

## 2017-08-20 DIAGNOSIS — M797 Fibromyalgia: Secondary | ICD-10-CM | POA: Diagnosis not present

## 2017-08-20 DIAGNOSIS — G894 Chronic pain syndrome: Secondary | ICD-10-CM | POA: Diagnosis not present

## 2017-08-20 DIAGNOSIS — G43909 Migraine, unspecified, not intractable, without status migrainosus: Secondary | ICD-10-CM | POA: Diagnosis not present

## 2017-08-20 DIAGNOSIS — R1013 Epigastric pain: Secondary | ICD-10-CM | POA: Diagnosis not present

## 2017-08-20 DIAGNOSIS — B009 Herpesviral infection, unspecified: Secondary | ICD-10-CM | POA: Diagnosis not present

## 2017-08-20 DIAGNOSIS — F418 Other specified anxiety disorders: Secondary | ICD-10-CM | POA: Diagnosis not present

## 2017-08-20 DIAGNOSIS — J309 Allergic rhinitis, unspecified: Secondary | ICD-10-CM | POA: Diagnosis not present

## 2017-08-20 DIAGNOSIS — Z720 Tobacco use: Secondary | ICD-10-CM | POA: Diagnosis not present

## 2017-08-22 DIAGNOSIS — Z79891 Long term (current) use of opiate analgesic: Secondary | ICD-10-CM | POA: Diagnosis not present

## 2017-08-22 DIAGNOSIS — M8580 Other specified disorders of bone density and structure, unspecified site: Secondary | ICD-10-CM | POA: Diagnosis not present

## 2017-08-22 DIAGNOSIS — M542 Cervicalgia: Secondary | ICD-10-CM | POA: Diagnosis not present

## 2017-08-22 DIAGNOSIS — G8929 Other chronic pain: Secondary | ICD-10-CM | POA: Diagnosis not present

## 2017-08-22 DIAGNOSIS — M797 Fibromyalgia: Secondary | ICD-10-CM | POA: Diagnosis not present

## 2017-08-22 DIAGNOSIS — M546 Pain in thoracic spine: Secondary | ICD-10-CM | POA: Diagnosis not present

## 2017-08-22 DIAGNOSIS — M545 Low back pain: Secondary | ICD-10-CM | POA: Diagnosis not present

## 2017-08-22 DIAGNOSIS — M549 Dorsalgia, unspecified: Secondary | ICD-10-CM | POA: Diagnosis not present

## 2017-08-30 DIAGNOSIS — F411 Generalized anxiety disorder: Secondary | ICD-10-CM | POA: Diagnosis not present

## 2017-09-17 DIAGNOSIS — M797 Fibromyalgia: Secondary | ICD-10-CM | POA: Diagnosis not present

## 2017-09-17 DIAGNOSIS — G894 Chronic pain syndrome: Secondary | ICD-10-CM | POA: Diagnosis not present

## 2017-09-17 DIAGNOSIS — G43909 Migraine, unspecified, not intractable, without status migrainosus: Secondary | ICD-10-CM | POA: Diagnosis not present

## 2017-09-17 DIAGNOSIS — Z720 Tobacco use: Secondary | ICD-10-CM | POA: Diagnosis not present

## 2017-09-17 DIAGNOSIS — J309 Allergic rhinitis, unspecified: Secondary | ICD-10-CM | POA: Diagnosis not present

## 2017-09-17 DIAGNOSIS — R1013 Epigastric pain: Secondary | ICD-10-CM | POA: Diagnosis not present

## 2017-09-17 DIAGNOSIS — I10 Essential (primary) hypertension: Secondary | ICD-10-CM | POA: Diagnosis not present

## 2017-09-17 DIAGNOSIS — F39 Unspecified mood [affective] disorder: Secondary | ICD-10-CM | POA: Diagnosis not present

## 2017-09-17 DIAGNOSIS — B009 Herpesviral infection, unspecified: Secondary | ICD-10-CM | POA: Diagnosis not present

## 2017-09-17 DIAGNOSIS — F418 Other specified anxiety disorders: Secondary | ICD-10-CM | POA: Diagnosis not present

## 2017-09-19 DIAGNOSIS — M542 Cervicalgia: Secondary | ICD-10-CM | POA: Diagnosis not present

## 2017-09-19 DIAGNOSIS — M8580 Other specified disorders of bone density and structure, unspecified site: Secondary | ICD-10-CM | POA: Diagnosis not present

## 2017-09-19 DIAGNOSIS — M545 Low back pain: Secondary | ICD-10-CM | POA: Diagnosis not present

## 2017-09-19 DIAGNOSIS — M797 Fibromyalgia: Secondary | ICD-10-CM | POA: Diagnosis not present

## 2017-09-19 DIAGNOSIS — G8929 Other chronic pain: Secondary | ICD-10-CM | POA: Diagnosis not present

## 2017-09-19 DIAGNOSIS — M546 Pain in thoracic spine: Secondary | ICD-10-CM | POA: Diagnosis not present

## 2017-09-19 DIAGNOSIS — Z79891 Long term (current) use of opiate analgesic: Secondary | ICD-10-CM | POA: Diagnosis not present

## 2017-10-09 ENCOUNTER — Ambulatory Visit: Payer: Self-pay | Admitting: Diagnostic Neuroimaging

## 2017-10-09 ENCOUNTER — Encounter

## 2017-10-10 ENCOUNTER — Encounter: Payer: Self-pay | Admitting: Diagnostic Neuroimaging

## 2017-10-15 DIAGNOSIS — F4310 Post-traumatic stress disorder, unspecified: Secondary | ICD-10-CM | POA: Diagnosis not present

## 2017-10-17 DIAGNOSIS — M546 Pain in thoracic spine: Secondary | ICD-10-CM | POA: Diagnosis not present

## 2017-10-17 DIAGNOSIS — G8929 Other chronic pain: Secondary | ICD-10-CM | POA: Diagnosis not present

## 2017-10-17 DIAGNOSIS — M797 Fibromyalgia: Secondary | ICD-10-CM | POA: Diagnosis not present

## 2017-10-17 DIAGNOSIS — M8580 Other specified disorders of bone density and structure, unspecified site: Secondary | ICD-10-CM | POA: Diagnosis not present

## 2017-10-17 DIAGNOSIS — M549 Dorsalgia, unspecified: Secondary | ICD-10-CM | POA: Diagnosis not present

## 2017-10-17 DIAGNOSIS — M542 Cervicalgia: Secondary | ICD-10-CM | POA: Diagnosis not present

## 2017-10-17 DIAGNOSIS — Z79891 Long term (current) use of opiate analgesic: Secondary | ICD-10-CM | POA: Diagnosis not present

## 2017-11-08 DIAGNOSIS — F418 Other specified anxiety disorders: Secondary | ICD-10-CM | POA: Diagnosis not present

## 2017-11-08 DIAGNOSIS — J309 Allergic rhinitis, unspecified: Secondary | ICD-10-CM | POA: Diagnosis not present

## 2017-11-08 DIAGNOSIS — Z720 Tobacco use: Secondary | ICD-10-CM | POA: Diagnosis not present

## 2017-11-08 DIAGNOSIS — R1013 Epigastric pain: Secondary | ICD-10-CM | POA: Diagnosis not present

## 2017-11-08 DIAGNOSIS — B373 Candidiasis of vulva and vagina: Secondary | ICD-10-CM | POA: Diagnosis not present

## 2017-11-08 DIAGNOSIS — I10 Essential (primary) hypertension: Secondary | ICD-10-CM | POA: Diagnosis not present

## 2017-11-08 DIAGNOSIS — F39 Unspecified mood [affective] disorder: Secondary | ICD-10-CM | POA: Diagnosis not present

## 2017-11-08 DIAGNOSIS — B009 Herpesviral infection, unspecified: Secondary | ICD-10-CM | POA: Diagnosis not present

## 2017-11-08 DIAGNOSIS — G894 Chronic pain syndrome: Secondary | ICD-10-CM | POA: Diagnosis not present

## 2017-11-08 DIAGNOSIS — N3000 Acute cystitis without hematuria: Secondary | ICD-10-CM | POA: Diagnosis not present

## 2017-11-08 DIAGNOSIS — M797 Fibromyalgia: Secondary | ICD-10-CM | POA: Diagnosis not present

## 2017-11-08 DIAGNOSIS — G43909 Migraine, unspecified, not intractable, without status migrainosus: Secondary | ICD-10-CM | POA: Diagnosis not present

## 2017-11-14 DIAGNOSIS — G8929 Other chronic pain: Secondary | ICD-10-CM | POA: Diagnosis not present

## 2017-11-14 DIAGNOSIS — M797 Fibromyalgia: Secondary | ICD-10-CM | POA: Diagnosis not present

## 2017-11-14 DIAGNOSIS — M549 Dorsalgia, unspecified: Secondary | ICD-10-CM | POA: Diagnosis not present

## 2017-11-14 DIAGNOSIS — Z23 Encounter for immunization: Secondary | ICD-10-CM | POA: Diagnosis not present

## 2017-11-14 DIAGNOSIS — M546 Pain in thoracic spine: Secondary | ICD-10-CM | POA: Diagnosis not present

## 2017-11-14 DIAGNOSIS — M542 Cervicalgia: Secondary | ICD-10-CM | POA: Diagnosis not present

## 2017-11-14 DIAGNOSIS — Z79891 Long term (current) use of opiate analgesic: Secondary | ICD-10-CM | POA: Diagnosis not present

## 2017-11-14 DIAGNOSIS — M8580 Other specified disorders of bone density and structure, unspecified site: Secondary | ICD-10-CM | POA: Diagnosis not present

## 2017-11-26 DIAGNOSIS — Z79891 Long term (current) use of opiate analgesic: Secondary | ICD-10-CM | POA: Diagnosis not present

## 2017-11-26 DIAGNOSIS — M549 Dorsalgia, unspecified: Secondary | ICD-10-CM | POA: Diagnosis not present

## 2017-11-26 DIAGNOSIS — M797 Fibromyalgia: Secondary | ICD-10-CM | POA: Diagnosis not present

## 2017-11-26 DIAGNOSIS — G8929 Other chronic pain: Secondary | ICD-10-CM | POA: Diagnosis not present

## 2017-11-26 DIAGNOSIS — M546 Pain in thoracic spine: Secondary | ICD-10-CM | POA: Diagnosis not present

## 2017-11-26 DIAGNOSIS — M542 Cervicalgia: Secondary | ICD-10-CM | POA: Diagnosis not present

## 2017-11-26 DIAGNOSIS — M8580 Other specified disorders of bone density and structure, unspecified site: Secondary | ICD-10-CM | POA: Diagnosis not present

## 2017-12-03 DIAGNOSIS — M2550 Pain in unspecified joint: Secondary | ICD-10-CM | POA: Diagnosis not present

## 2017-12-03 DIAGNOSIS — M797 Fibromyalgia: Secondary | ICD-10-CM | POA: Diagnosis not present

## 2017-12-19 DIAGNOSIS — Z3009 Encounter for other general counseling and advice on contraception: Secondary | ICD-10-CM | POA: Diagnosis not present

## 2017-12-23 DIAGNOSIS — Z720 Tobacco use: Secondary | ICD-10-CM | POA: Diagnosis not present

## 2017-12-23 DIAGNOSIS — I10 Essential (primary) hypertension: Secondary | ICD-10-CM | POA: Diagnosis not present

## 2017-12-23 DIAGNOSIS — G894 Chronic pain syndrome: Secondary | ICD-10-CM | POA: Diagnosis not present

## 2017-12-23 DIAGNOSIS — R1013 Epigastric pain: Secondary | ICD-10-CM | POA: Diagnosis not present

## 2017-12-23 DIAGNOSIS — B009 Herpesviral infection, unspecified: Secondary | ICD-10-CM | POA: Diagnosis not present

## 2017-12-23 DIAGNOSIS — G43909 Migraine, unspecified, not intractable, without status migrainosus: Secondary | ICD-10-CM | POA: Diagnosis not present

## 2017-12-23 DIAGNOSIS — F39 Unspecified mood [affective] disorder: Secondary | ICD-10-CM | POA: Diagnosis not present

## 2017-12-23 DIAGNOSIS — J309 Allergic rhinitis, unspecified: Secondary | ICD-10-CM | POA: Diagnosis not present

## 2017-12-23 DIAGNOSIS — M797 Fibromyalgia: Secondary | ICD-10-CM | POA: Diagnosis not present

## 2017-12-23 DIAGNOSIS — F418 Other specified anxiety disorders: Secondary | ICD-10-CM | POA: Diagnosis not present

## 2017-12-31 DIAGNOSIS — F39 Unspecified mood [affective] disorder: Secondary | ICD-10-CM | POA: Diagnosis not present

## 2017-12-31 DIAGNOSIS — G43909 Migraine, unspecified, not intractable, without status migrainosus: Secondary | ICD-10-CM | POA: Diagnosis not present

## 2017-12-31 DIAGNOSIS — B009 Herpesviral infection, unspecified: Secondary | ICD-10-CM | POA: Diagnosis not present

## 2017-12-31 DIAGNOSIS — Z720 Tobacco use: Secondary | ICD-10-CM | POA: Diagnosis not present

## 2017-12-31 DIAGNOSIS — G894 Chronic pain syndrome: Secondary | ICD-10-CM | POA: Diagnosis not present

## 2017-12-31 DIAGNOSIS — R1013 Epigastric pain: Secondary | ICD-10-CM | POA: Diagnosis not present

## 2017-12-31 DIAGNOSIS — M797 Fibromyalgia: Secondary | ICD-10-CM | POA: Diagnosis not present

## 2017-12-31 DIAGNOSIS — J309 Allergic rhinitis, unspecified: Secondary | ICD-10-CM | POA: Diagnosis not present

## 2017-12-31 DIAGNOSIS — I10 Essential (primary) hypertension: Secondary | ICD-10-CM | POA: Diagnosis not present

## 2017-12-31 DIAGNOSIS — F418 Other specified anxiety disorders: Secondary | ICD-10-CM | POA: Diagnosis not present

## 2018-01-06 ENCOUNTER — Other Ambulatory Visit: Payer: Self-pay | Admitting: Diagnostic Neuroimaging

## 2018-01-06 DIAGNOSIS — Z79899 Other long term (current) drug therapy: Secondary | ICD-10-CM | POA: Diagnosis not present

## 2018-01-15 DIAGNOSIS — Z30430 Encounter for insertion of intrauterine contraceptive device: Secondary | ICD-10-CM | POA: Diagnosis not present

## 2018-01-15 DIAGNOSIS — Z3202 Encounter for pregnancy test, result negative: Secondary | ICD-10-CM | POA: Diagnosis not present

## 2018-01-30 DIAGNOSIS — M797 Fibromyalgia: Secondary | ICD-10-CM | POA: Diagnosis not present

## 2018-01-30 DIAGNOSIS — G43909 Migraine, unspecified, not intractable, without status migrainosus: Secondary | ICD-10-CM | POA: Diagnosis not present

## 2018-01-30 DIAGNOSIS — Z79899 Other long term (current) drug therapy: Secondary | ICD-10-CM | POA: Diagnosis not present

## 2018-01-30 DIAGNOSIS — M461 Sacroiliitis, not elsewhere classified: Secondary | ICD-10-CM | POA: Diagnosis not present

## 2018-02-27 DIAGNOSIS — Z720 Tobacco use: Secondary | ICD-10-CM | POA: Diagnosis not present

## 2018-02-27 DIAGNOSIS — J309 Allergic rhinitis, unspecified: Secondary | ICD-10-CM | POA: Diagnosis not present

## 2018-02-27 DIAGNOSIS — M797 Fibromyalgia: Secondary | ICD-10-CM | POA: Diagnosis not present

## 2018-02-27 DIAGNOSIS — R1013 Epigastric pain: Secondary | ICD-10-CM | POA: Diagnosis not present

## 2018-02-27 DIAGNOSIS — G43909 Migraine, unspecified, not intractable, without status migrainosus: Secondary | ICD-10-CM | POA: Diagnosis not present

## 2018-02-27 DIAGNOSIS — Z01118 Encounter for examination of ears and hearing with other abnormal findings: Secondary | ICD-10-CM | POA: Diagnosis not present

## 2018-02-27 DIAGNOSIS — Z5181 Encounter for therapeutic drug level monitoring: Secondary | ICD-10-CM | POA: Diagnosis not present

## 2018-02-27 DIAGNOSIS — I10 Essential (primary) hypertension: Secondary | ICD-10-CM | POA: Diagnosis not present

## 2018-02-27 DIAGNOSIS — Z131 Encounter for screening for diabetes mellitus: Secondary | ICD-10-CM | POA: Diagnosis not present

## 2018-02-27 DIAGNOSIS — F418 Other specified anxiety disorders: Secondary | ICD-10-CM | POA: Diagnosis not present

## 2018-02-28 DIAGNOSIS — M461 Sacroiliitis, not elsewhere classified: Secondary | ICD-10-CM | POA: Diagnosis not present

## 2018-02-28 DIAGNOSIS — N946 Dysmenorrhea, unspecified: Secondary | ICD-10-CM | POA: Diagnosis not present

## 2018-02-28 DIAGNOSIS — G43909 Migraine, unspecified, not intractable, without status migrainosus: Secondary | ICD-10-CM | POA: Diagnosis not present

## 2018-02-28 DIAGNOSIS — Z79899 Other long term (current) drug therapy: Secondary | ICD-10-CM | POA: Diagnosis not present

## 2018-02-28 DIAGNOSIS — M797 Fibromyalgia: Secondary | ICD-10-CM | POA: Diagnosis not present

## 2018-03-07 DIAGNOSIS — Z79899 Other long term (current) drug therapy: Secondary | ICD-10-CM | POA: Diagnosis not present

## 2018-03-07 DIAGNOSIS — R945 Abnormal results of liver function studies: Secondary | ICD-10-CM | POA: Diagnosis not present

## 2018-03-13 ENCOUNTER — Other Ambulatory Visit: Payer: Self-pay | Admitting: Diagnostic Neuroimaging

## 2018-03-20 DIAGNOSIS — M62838 Other muscle spasm: Secondary | ICD-10-CM | POA: Diagnosis not present

## 2018-03-20 DIAGNOSIS — M797 Fibromyalgia: Secondary | ICD-10-CM | POA: Diagnosis not present

## 2018-03-20 DIAGNOSIS — S32000S Wedge compression fracture of unspecified lumbar vertebra, sequela: Secondary | ICD-10-CM | POA: Diagnosis not present

## 2018-03-20 DIAGNOSIS — Z79899 Other long term (current) drug therapy: Secondary | ICD-10-CM | POA: Diagnosis not present

## 2018-03-20 DIAGNOSIS — F17200 Nicotine dependence, unspecified, uncomplicated: Secondary | ICD-10-CM | POA: Diagnosis not present

## 2018-03-20 DIAGNOSIS — Z87891 Personal history of nicotine dependence: Secondary | ICD-10-CM | POA: Diagnosis not present

## 2018-03-21 DIAGNOSIS — R945 Abnormal results of liver function studies: Secondary | ICD-10-CM | POA: Diagnosis not present

## 2018-03-21 DIAGNOSIS — R1084 Generalized abdominal pain: Secondary | ICD-10-CM | POA: Diagnosis not present

## 2018-03-21 DIAGNOSIS — N946 Dysmenorrhea, unspecified: Secondary | ICD-10-CM | POA: Diagnosis not present

## 2018-03-21 DIAGNOSIS — K219 Gastro-esophageal reflux disease without esophagitis: Secondary | ICD-10-CM | POA: Diagnosis not present

## 2018-03-26 DIAGNOSIS — R945 Abnormal results of liver function studies: Secondary | ICD-10-CM | POA: Diagnosis not present

## 2018-04-07 DIAGNOSIS — K219 Gastro-esophageal reflux disease without esophagitis: Secondary | ICD-10-CM | POA: Diagnosis not present

## 2018-04-07 DIAGNOSIS — Z01818 Encounter for other preprocedural examination: Secondary | ICD-10-CM | POA: Diagnosis not present

## 2018-04-09 DIAGNOSIS — K2270 Barrett's esophagus without dysplasia: Secondary | ICD-10-CM | POA: Diagnosis not present

## 2018-04-18 DIAGNOSIS — Z79899 Other long term (current) drug therapy: Secondary | ICD-10-CM | POA: Diagnosis not present

## 2018-04-29 DIAGNOSIS — K2270 Barrett's esophagus without dysplasia: Secondary | ICD-10-CM | POA: Diagnosis not present

## 2018-04-29 DIAGNOSIS — J309 Allergic rhinitis, unspecified: Secondary | ICD-10-CM | POA: Diagnosis not present

## 2018-04-29 DIAGNOSIS — Z113 Encounter for screening for infections with a predominantly sexual mode of transmission: Secondary | ICD-10-CM | POA: Diagnosis not present

## 2018-04-29 DIAGNOSIS — Z124 Encounter for screening for malignant neoplasm of cervix: Secondary | ICD-10-CM | POA: Diagnosis not present

## 2018-04-29 DIAGNOSIS — I10 Essential (primary) hypertension: Secondary | ICD-10-CM | POA: Diagnosis not present

## 2018-04-29 DIAGNOSIS — E010 Iodine-deficiency related diffuse (endemic) goiter: Secondary | ICD-10-CM | POA: Diagnosis not present

## 2018-04-29 DIAGNOSIS — F418 Other specified anxiety disorders: Secondary | ICD-10-CM | POA: Diagnosis not present

## 2018-04-29 DIAGNOSIS — G43909 Migraine, unspecified, not intractable, without status migrainosus: Secondary | ICD-10-CM | POA: Diagnosis not present

## 2018-04-29 DIAGNOSIS — K219 Gastro-esophageal reflux disease without esophagitis: Secondary | ICD-10-CM | POA: Diagnosis not present

## 2018-04-29 DIAGNOSIS — Z720 Tobacco use: Secondary | ICD-10-CM | POA: Diagnosis not present

## 2018-04-29 DIAGNOSIS — E011 Iodine-deficiency related multinodular (endemic) goiter: Secondary | ICD-10-CM | POA: Diagnosis not present

## 2018-04-29 DIAGNOSIS — K2900 Acute gastritis without bleeding: Secondary | ICD-10-CM | POA: Diagnosis not present

## 2018-04-29 DIAGNOSIS — R1013 Epigastric pain: Secondary | ICD-10-CM | POA: Diagnosis not present

## 2018-04-29 DIAGNOSIS — M797 Fibromyalgia: Secondary | ICD-10-CM | POA: Diagnosis not present

## 2018-05-15 DIAGNOSIS — Z720 Tobacco use: Secondary | ICD-10-CM | POA: Diagnosis not present

## 2018-05-15 DIAGNOSIS — R600 Localized edema: Secondary | ICD-10-CM | POA: Diagnosis not present

## 2018-05-15 DIAGNOSIS — R1013 Epigastric pain: Secondary | ICD-10-CM | POA: Diagnosis not present

## 2018-05-15 DIAGNOSIS — J309 Allergic rhinitis, unspecified: Secondary | ICD-10-CM | POA: Diagnosis not present

## 2018-05-15 DIAGNOSIS — M797 Fibromyalgia: Secondary | ICD-10-CM | POA: Diagnosis not present

## 2018-05-15 DIAGNOSIS — F418 Other specified anxiety disorders: Secondary | ICD-10-CM | POA: Diagnosis not present

## 2018-05-15 DIAGNOSIS — G43909 Migraine, unspecified, not intractable, without status migrainosus: Secondary | ICD-10-CM | POA: Diagnosis not present

## 2018-05-15 DIAGNOSIS — Z79899 Other long term (current) drug therapy: Secondary | ICD-10-CM | POA: Diagnosis not present

## 2018-05-15 DIAGNOSIS — I10 Essential (primary) hypertension: Secondary | ICD-10-CM | POA: Diagnosis not present

## 2018-05-21 DIAGNOSIS — J309 Allergic rhinitis, unspecified: Secondary | ICD-10-CM | POA: Diagnosis not present

## 2018-05-21 DIAGNOSIS — R600 Localized edema: Secondary | ICD-10-CM | POA: Diagnosis not present

## 2018-05-21 DIAGNOSIS — F418 Other specified anxiety disorders: Secondary | ICD-10-CM | POA: Diagnosis not present

## 2018-05-21 DIAGNOSIS — M797 Fibromyalgia: Secondary | ICD-10-CM | POA: Diagnosis not present

## 2018-05-21 DIAGNOSIS — G43909 Migraine, unspecified, not intractable, without status migrainosus: Secondary | ICD-10-CM | POA: Diagnosis not present

## 2018-05-21 DIAGNOSIS — I10 Essential (primary) hypertension: Secondary | ICD-10-CM | POA: Diagnosis not present

## 2018-05-21 DIAGNOSIS — R1013 Epigastric pain: Secondary | ICD-10-CM | POA: Diagnosis not present

## 2018-05-21 DIAGNOSIS — Z720 Tobacco use: Secondary | ICD-10-CM | POA: Diagnosis not present

## 2018-06-03 DIAGNOSIS — D225 Melanocytic nevi of trunk: Secondary | ICD-10-CM | POA: Diagnosis not present

## 2018-06-03 DIAGNOSIS — L989 Disorder of the skin and subcutaneous tissue, unspecified: Secondary | ICD-10-CM | POA: Diagnosis not present

## 2018-06-03 DIAGNOSIS — L700 Acne vulgaris: Secondary | ICD-10-CM | POA: Diagnosis not present

## 2018-06-11 DIAGNOSIS — L281 Prurigo nodularis: Secondary | ICD-10-CM | POA: Diagnosis not present

## 2018-06-11 DIAGNOSIS — G43909 Migraine, unspecified, not intractable, without status migrainosus: Secondary | ICD-10-CM | POA: Diagnosis not present

## 2018-06-11 DIAGNOSIS — R600 Localized edema: Secondary | ICD-10-CM | POA: Diagnosis not present

## 2018-06-11 DIAGNOSIS — J309 Allergic rhinitis, unspecified: Secondary | ICD-10-CM | POA: Diagnosis not present

## 2018-06-11 DIAGNOSIS — Z720 Tobacco use: Secondary | ICD-10-CM | POA: Diagnosis not present

## 2018-06-11 DIAGNOSIS — F418 Other specified anxiety disorders: Secondary | ICD-10-CM | POA: Diagnosis not present

## 2018-06-11 DIAGNOSIS — M797 Fibromyalgia: Secondary | ICD-10-CM | POA: Diagnosis not present

## 2018-06-11 DIAGNOSIS — R1013 Epigastric pain: Secondary | ICD-10-CM | POA: Diagnosis not present

## 2018-06-11 DIAGNOSIS — I10 Essential (primary) hypertension: Secondary | ICD-10-CM | POA: Diagnosis not present

## 2018-06-12 DIAGNOSIS — L281 Prurigo nodularis: Secondary | ICD-10-CM | POA: Diagnosis not present

## 2018-06-13 DIAGNOSIS — F17210 Nicotine dependence, cigarettes, uncomplicated: Secondary | ICD-10-CM | POA: Diagnosis not present

## 2018-06-13 DIAGNOSIS — N912 Amenorrhea, unspecified: Secondary | ICD-10-CM | POA: Diagnosis not present

## 2018-06-13 DIAGNOSIS — M797 Fibromyalgia: Secondary | ICD-10-CM | POA: Diagnosis not present

## 2018-06-13 DIAGNOSIS — Z720 Tobacco use: Secondary | ICD-10-CM | POA: Diagnosis not present

## 2018-06-13 DIAGNOSIS — M25511 Pain in right shoulder: Secondary | ICD-10-CM | POA: Diagnosis not present

## 2018-06-13 DIAGNOSIS — Z79899 Other long term (current) drug therapy: Secondary | ICD-10-CM | POA: Diagnosis not present

## 2018-06-13 DIAGNOSIS — F17200 Nicotine dependence, unspecified, uncomplicated: Secondary | ICD-10-CM | POA: Diagnosis not present

## 2018-06-26 DIAGNOSIS — R1013 Epigastric pain: Secondary | ICD-10-CM | POA: Diagnosis not present

## 2018-06-26 DIAGNOSIS — I10 Essential (primary) hypertension: Secondary | ICD-10-CM | POA: Diagnosis not present

## 2018-06-26 DIAGNOSIS — G43909 Migraine, unspecified, not intractable, without status migrainosus: Secondary | ICD-10-CM | POA: Diagnosis not present

## 2018-06-26 DIAGNOSIS — R600 Localized edema: Secondary | ICD-10-CM | POA: Diagnosis not present

## 2018-06-26 DIAGNOSIS — M797 Fibromyalgia: Secondary | ICD-10-CM | POA: Diagnosis not present

## 2018-06-26 DIAGNOSIS — Z720 Tobacco use: Secondary | ICD-10-CM | POA: Diagnosis not present

## 2018-06-26 DIAGNOSIS — F418 Other specified anxiety disorders: Secondary | ICD-10-CM | POA: Diagnosis not present

## 2018-06-26 DIAGNOSIS — Z0001 Encounter for general adult medical examination with abnormal findings: Secondary | ICD-10-CM | POA: Diagnosis not present

## 2018-06-26 DIAGNOSIS — J309 Allergic rhinitis, unspecified: Secondary | ICD-10-CM | POA: Diagnosis not present

## 2018-06-26 DIAGNOSIS — R1084 Generalized abdominal pain: Secondary | ICD-10-CM | POA: Diagnosis not present

## 2018-07-09 ENCOUNTER — Telehealth: Payer: Self-pay | Admitting: Diagnostic Neuroimaging

## 2018-07-09 ENCOUNTER — Encounter: Payer: Self-pay | Admitting: Diagnostic Neuroimaging

## 2018-07-09 NOTE — Telephone Encounter (Signed)
°  Due to current COVID 19 pandemic, our office is severely reducing in office visits until further notice, in order to minimize the risk to our patients and healthcare providers.    Called patient and scheduled a virtual visit with Dr. Leta Baptist  for 07/14/2018 . Patient verbalized understanding of the [email or web address] process and I have sent an e-mail to [email or web address]  with link and instructions as well as my name and our office number. Patient understands that they will receive a call from RN to update chart.   Pt understands that although there may be some limitations with this type of visit, we will take all precautions to reduce any security or privacy concerns.  Pt understands that this will be treated like an in office visit and we will file with pt's insurance, and there may be a patient responsible charge related to this service.

## 2018-07-09 NOTE — Telephone Encounter (Signed)
Spoke with patient and updated EMR. 

## 2018-07-09 NOTE — Addendum Note (Signed)
Addended by: Minna Antis on: 07/09/2018 04:30 PM   Modules accepted: Orders

## 2018-07-10 DIAGNOSIS — F17210 Nicotine dependence, cigarettes, uncomplicated: Secondary | ICD-10-CM | POA: Diagnosis not present

## 2018-07-10 DIAGNOSIS — Z79899 Other long term (current) drug therapy: Secondary | ICD-10-CM | POA: Diagnosis not present

## 2018-07-10 DIAGNOSIS — M797 Fibromyalgia: Secondary | ICD-10-CM | POA: Diagnosis not present

## 2018-07-10 DIAGNOSIS — Z1159 Encounter for screening for other viral diseases: Secondary | ICD-10-CM | POA: Diagnosis not present

## 2018-07-10 DIAGNOSIS — Z79891 Long term (current) use of opiate analgesic: Secondary | ICD-10-CM | POA: Diagnosis not present

## 2018-07-10 DIAGNOSIS — N912 Amenorrhea, unspecified: Secondary | ICD-10-CM | POA: Diagnosis not present

## 2018-07-14 ENCOUNTER — Institutional Professional Consult (permissible substitution) (INDEPENDENT_AMBULATORY_CARE_PROVIDER_SITE_OTHER): Payer: Self-pay | Admitting: Diagnostic Neuroimaging

## 2018-07-14 ENCOUNTER — Other Ambulatory Visit: Payer: Self-pay

## 2018-07-14 DIAGNOSIS — Z0289 Encounter for other administrative examinations: Secondary | ICD-10-CM

## 2018-07-15 NOTE — Telephone Encounter (Signed)
Attempted to reach patient re: she did not connect for video visit yesterday. No answer, voice MB full. If she calls back she may reschedule a video visit.

## 2018-07-21 DIAGNOSIS — R600 Localized edema: Secondary | ICD-10-CM | POA: Diagnosis not present

## 2018-07-21 DIAGNOSIS — M797 Fibromyalgia: Secondary | ICD-10-CM | POA: Diagnosis not present

## 2018-07-21 DIAGNOSIS — R1013 Epigastric pain: Secondary | ICD-10-CM | POA: Diagnosis not present

## 2018-07-21 DIAGNOSIS — Z720 Tobacco use: Secondary | ICD-10-CM | POA: Diagnosis not present

## 2018-07-21 DIAGNOSIS — I10 Essential (primary) hypertension: Secondary | ICD-10-CM | POA: Diagnosis not present

## 2018-07-21 DIAGNOSIS — J309 Allergic rhinitis, unspecified: Secondary | ICD-10-CM | POA: Diagnosis not present

## 2018-07-21 DIAGNOSIS — R1084 Generalized abdominal pain: Secondary | ICD-10-CM | POA: Diagnosis not present

## 2018-07-21 DIAGNOSIS — F418 Other specified anxiety disorders: Secondary | ICD-10-CM | POA: Diagnosis not present

## 2018-07-21 DIAGNOSIS — G43909 Migraine, unspecified, not intractable, without status migrainosus: Secondary | ICD-10-CM | POA: Diagnosis not present

## 2018-08-10 DIAGNOSIS — Z79899 Other long term (current) drug therapy: Secondary | ICD-10-CM | POA: Diagnosis not present

## 2018-08-10 DIAGNOSIS — Z1159 Encounter for screening for other viral diseases: Secondary | ICD-10-CM | POA: Diagnosis not present

## 2018-08-14 DIAGNOSIS — Z720 Tobacco use: Secondary | ICD-10-CM | POA: Diagnosis not present

## 2018-08-14 DIAGNOSIS — G43909 Migraine, unspecified, not intractable, without status migrainosus: Secondary | ICD-10-CM | POA: Diagnosis not present

## 2018-08-14 DIAGNOSIS — I10 Essential (primary) hypertension: Secondary | ICD-10-CM | POA: Diagnosis not present

## 2018-08-14 DIAGNOSIS — M797 Fibromyalgia: Secondary | ICD-10-CM | POA: Diagnosis not present

## 2018-08-14 DIAGNOSIS — Z5181 Encounter for therapeutic drug level monitoring: Secondary | ICD-10-CM | POA: Diagnosis not present

## 2018-08-14 DIAGNOSIS — R600 Localized edema: Secondary | ICD-10-CM | POA: Diagnosis not present

## 2018-08-14 DIAGNOSIS — F418 Other specified anxiety disorders: Secondary | ICD-10-CM | POA: Diagnosis not present

## 2018-08-14 DIAGNOSIS — J309 Allergic rhinitis, unspecified: Secondary | ICD-10-CM | POA: Diagnosis not present

## 2018-08-14 DIAGNOSIS — R1013 Epigastric pain: Secondary | ICD-10-CM | POA: Diagnosis not present

## 2018-08-26 DIAGNOSIS — J309 Allergic rhinitis, unspecified: Secondary | ICD-10-CM | POA: Diagnosis not present

## 2018-08-26 DIAGNOSIS — M797 Fibromyalgia: Secondary | ICD-10-CM | POA: Diagnosis not present

## 2018-08-26 DIAGNOSIS — Z5181 Encounter for therapeutic drug level monitoring: Secondary | ICD-10-CM | POA: Diagnosis not present

## 2018-08-26 DIAGNOSIS — G43909 Migraine, unspecified, not intractable, without status migrainosus: Secondary | ICD-10-CM | POA: Diagnosis not present

## 2018-08-26 DIAGNOSIS — Z720 Tobacco use: Secondary | ICD-10-CM | POA: Diagnosis not present

## 2018-08-26 DIAGNOSIS — R1013 Epigastric pain: Secondary | ICD-10-CM | POA: Diagnosis not present

## 2018-08-26 DIAGNOSIS — I10 Essential (primary) hypertension: Secondary | ICD-10-CM | POA: Diagnosis not present

## 2018-08-26 DIAGNOSIS — R600 Localized edema: Secondary | ICD-10-CM | POA: Diagnosis not present

## 2018-08-26 DIAGNOSIS — F418 Other specified anxiety disorders: Secondary | ICD-10-CM | POA: Diagnosis not present

## 2018-08-29 DIAGNOSIS — F418 Other specified anxiety disorders: Secondary | ICD-10-CM | POA: Diagnosis not present

## 2018-09-02 DIAGNOSIS — N760 Acute vaginitis: Secondary | ICD-10-CM | POA: Diagnosis not present

## 2018-09-02 DIAGNOSIS — N898 Other specified noninflammatory disorders of vagina: Secondary | ICD-10-CM | POA: Diagnosis not present

## 2018-09-02 DIAGNOSIS — B9689 Other specified bacterial agents as the cause of diseases classified elsewhere: Secondary | ICD-10-CM | POA: Diagnosis not present

## 2018-09-12 DIAGNOSIS — F3132 Bipolar disorder, current episode depressed, moderate: Secondary | ICD-10-CM | POA: Diagnosis not present

## 2018-09-12 DIAGNOSIS — F411 Generalized anxiety disorder: Secondary | ICD-10-CM | POA: Diagnosis not present

## 2018-09-12 DIAGNOSIS — F331 Major depressive disorder, recurrent, moderate: Secondary | ICD-10-CM | POA: Diagnosis not present

## 2018-09-23 DIAGNOSIS — F17210 Nicotine dependence, cigarettes, uncomplicated: Secondary | ICD-10-CM | POA: Diagnosis not present

## 2018-09-23 DIAGNOSIS — Z79899 Other long term (current) drug therapy: Secondary | ICD-10-CM | POA: Diagnosis not present

## 2018-09-23 DIAGNOSIS — R300 Dysuria: Secondary | ICD-10-CM | POA: Diagnosis not present

## 2018-09-23 DIAGNOSIS — M797 Fibromyalgia: Secondary | ICD-10-CM | POA: Diagnosis not present

## 2018-09-23 DIAGNOSIS — Z3200 Encounter for pregnancy test, result unknown: Secondary | ICD-10-CM | POA: Diagnosis not present

## 2018-09-23 DIAGNOSIS — Z720 Tobacco use: Secondary | ICD-10-CM | POA: Diagnosis not present

## 2018-10-07 DIAGNOSIS — F411 Generalized anxiety disorder: Secondary | ICD-10-CM | POA: Diagnosis not present

## 2018-10-07 DIAGNOSIS — F3132 Bipolar disorder, current episode depressed, moderate: Secondary | ICD-10-CM | POA: Diagnosis not present

## 2018-10-07 DIAGNOSIS — F331 Major depressive disorder, recurrent, moderate: Secondary | ICD-10-CM | POA: Diagnosis not present

## 2018-10-09 DIAGNOSIS — S060X9A Concussion with loss of consciousness of unspecified duration, initial encounter: Secondary | ICD-10-CM | POA: Diagnosis not present

## 2018-10-09 DIAGNOSIS — S0181XA Laceration without foreign body of other part of head, initial encounter: Secondary | ICD-10-CM | POA: Diagnosis not present

## 2018-10-09 DIAGNOSIS — M542 Cervicalgia: Secondary | ICD-10-CM | POA: Diagnosis not present

## 2018-10-20 DIAGNOSIS — E785 Hyperlipidemia, unspecified: Secondary | ICD-10-CM | POA: Diagnosis not present

## 2018-10-20 DIAGNOSIS — R5383 Other fatigue: Secondary | ICD-10-CM | POA: Diagnosis not present

## 2018-10-20 DIAGNOSIS — R109 Unspecified abdominal pain: Secondary | ICD-10-CM | POA: Diagnosis not present

## 2018-10-20 DIAGNOSIS — Z09 Encounter for follow-up examination after completed treatment for conditions other than malignant neoplasm: Secondary | ICD-10-CM | POA: Diagnosis not present

## 2018-10-21 DIAGNOSIS — D511 Vitamin B12 deficiency anemia due to selective vitamin B12 malabsorption with proteinuria: Secondary | ICD-10-CM | POA: Diagnosis not present

## 2018-10-21 DIAGNOSIS — E559 Vitamin D deficiency, unspecified: Secondary | ICD-10-CM | POA: Diagnosis not present

## 2018-10-21 DIAGNOSIS — E785 Hyperlipidemia, unspecified: Secondary | ICD-10-CM | POA: Diagnosis not present

## 2018-10-21 DIAGNOSIS — R5383 Other fatigue: Secondary | ICD-10-CM | POA: Diagnosis not present

## 2018-10-21 DIAGNOSIS — G933 Postviral fatigue syndrome: Secondary | ICD-10-CM | POA: Diagnosis not present

## 2018-10-21 DIAGNOSIS — Z09 Encounter for follow-up examination after completed treatment for conditions other than malignant neoplasm: Secondary | ICD-10-CM | POA: Diagnosis not present

## 2018-10-21 DIAGNOSIS — D649 Anemia, unspecified: Secondary | ICD-10-CM | POA: Diagnosis not present

## 2018-10-21 DIAGNOSIS — K2990 Gastroduodenitis, unspecified, without bleeding: Secondary | ICD-10-CM | POA: Diagnosis not present

## 2018-10-23 DIAGNOSIS — N390 Urinary tract infection, site not specified: Secondary | ICD-10-CM | POA: Diagnosis not present

## 2018-10-23 DIAGNOSIS — Z1159 Encounter for screening for other viral diseases: Secondary | ICD-10-CM | POA: Diagnosis not present

## 2018-10-23 DIAGNOSIS — Z79899 Other long term (current) drug therapy: Secondary | ICD-10-CM | POA: Diagnosis not present

## 2018-11-07 DIAGNOSIS — R945 Abnormal results of liver function studies: Secondary | ICD-10-CM | POA: Diagnosis not present

## 2018-11-07 DIAGNOSIS — F419 Anxiety disorder, unspecified: Secondary | ICD-10-CM | POA: Diagnosis not present

## 2018-11-07 DIAGNOSIS — R1013 Epigastric pain: Secondary | ICD-10-CM | POA: Diagnosis not present

## 2018-11-07 DIAGNOSIS — R799 Abnormal finding of blood chemistry, unspecified: Secondary | ICD-10-CM | POA: Diagnosis not present

## 2018-11-07 DIAGNOSIS — R7301 Impaired fasting glucose: Secondary | ICD-10-CM | POA: Diagnosis not present

## 2018-11-22 DIAGNOSIS — R519 Headache, unspecified: Secondary | ICD-10-CM | POA: Diagnosis not present

## 2018-11-22 DIAGNOSIS — M545 Low back pain: Secondary | ICD-10-CM | POA: Diagnosis not present

## 2018-11-26 DIAGNOSIS — F419 Anxiety disorder, unspecified: Secondary | ICD-10-CM | POA: Diagnosis not present

## 2018-11-26 DIAGNOSIS — G43909 Migraine, unspecified, not intractable, without status migrainosus: Secondary | ICD-10-CM | POA: Diagnosis not present

## 2018-11-26 DIAGNOSIS — D7589 Other specified diseases of blood and blood-forming organs: Secondary | ICD-10-CM | POA: Diagnosis not present

## 2018-12-06 DIAGNOSIS — Z79891 Long term (current) use of opiate analgesic: Secondary | ICD-10-CM | POA: Diagnosis not present

## 2018-12-06 DIAGNOSIS — Z79899 Other long term (current) drug therapy: Secondary | ICD-10-CM | POA: Diagnosis not present

## 2019-01-09 DIAGNOSIS — N390 Urinary tract infection, site not specified: Secondary | ICD-10-CM | POA: Diagnosis not present

## 2019-01-09 DIAGNOSIS — Z79899 Other long term (current) drug therapy: Secondary | ICD-10-CM | POA: Diagnosis not present

## 2019-01-14 IMAGING — MG 2D DIGITAL DIAGNOSTIC BILATERAL MAMMOGRAM WITH CAD AND ADJUNCT T
8 of 15 series · 8 of 35 positions shown · non-contrast
Comparison: None.

CLINICAL DATA: Patient describes a right breast lump at the 11
o'clock axis for 1 week. This is patient's baseline mammogram.

Mother with breast cancer at age 38.
EXAM:
2D DIGITAL DIAGNOSTIC BILATERAL MAMMOGRAM WITH CAD AND ADJUNCT TOMO
ULTRASOUND RIGHT BREAST

[R MLO synth-2D]
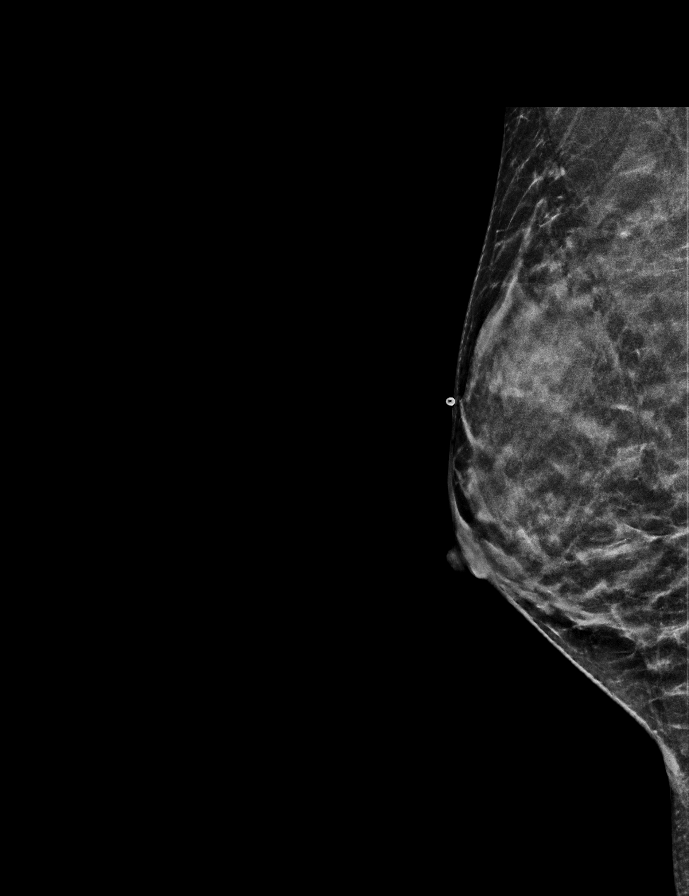

[L MLO]
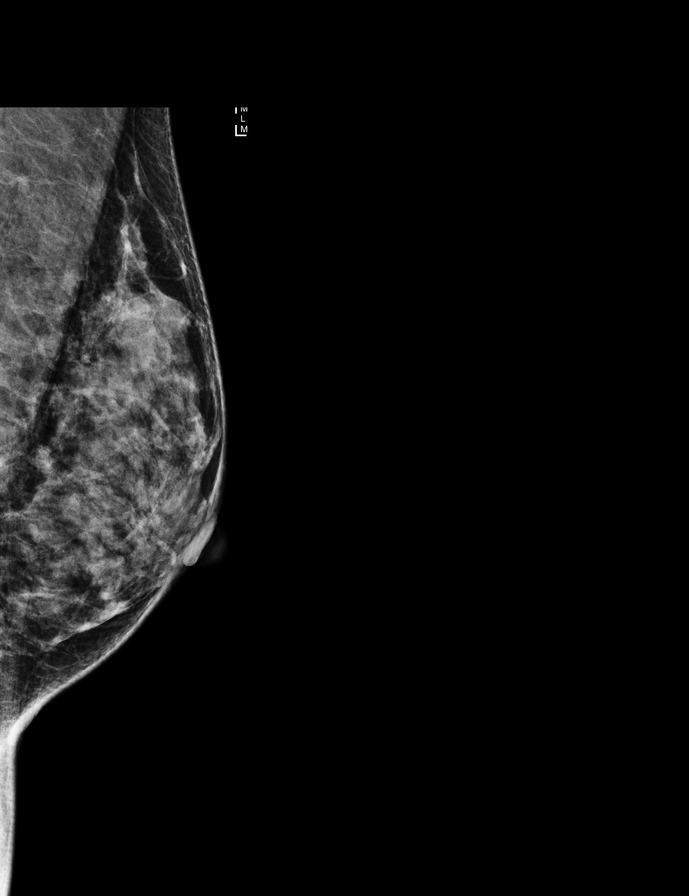

[R MLO]
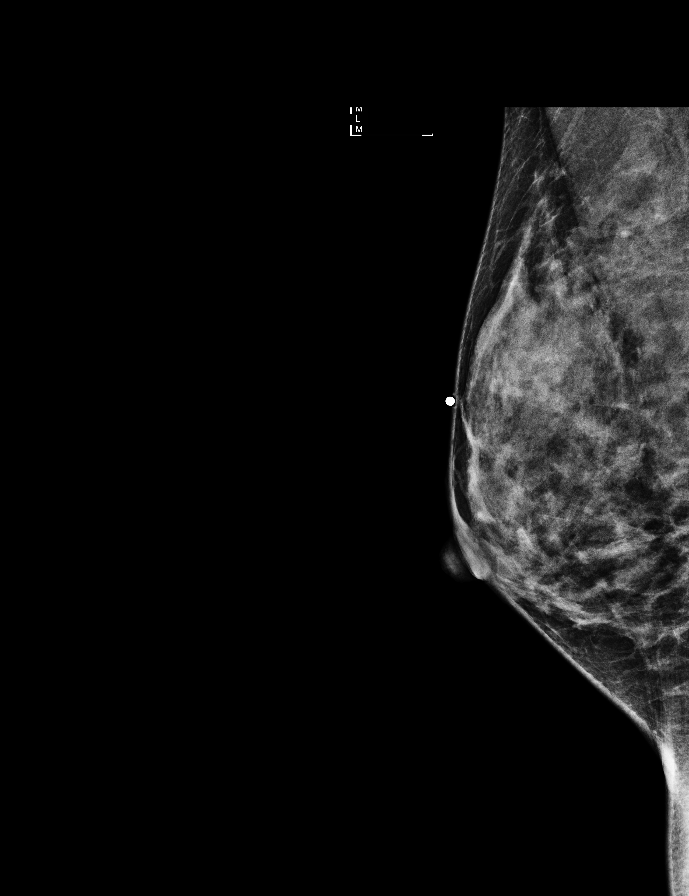

[R CC synth-2D]
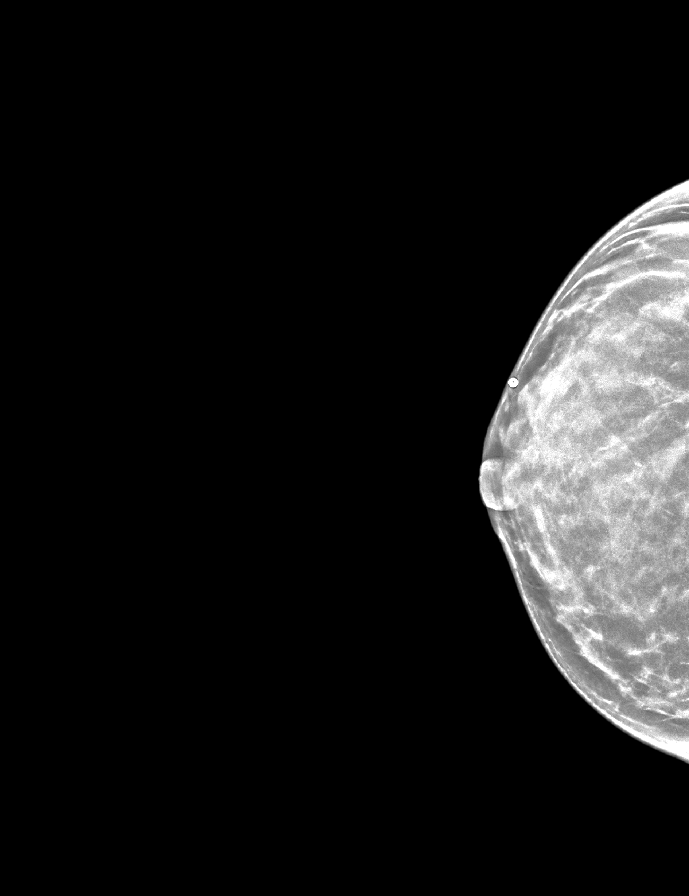

[R TAN]
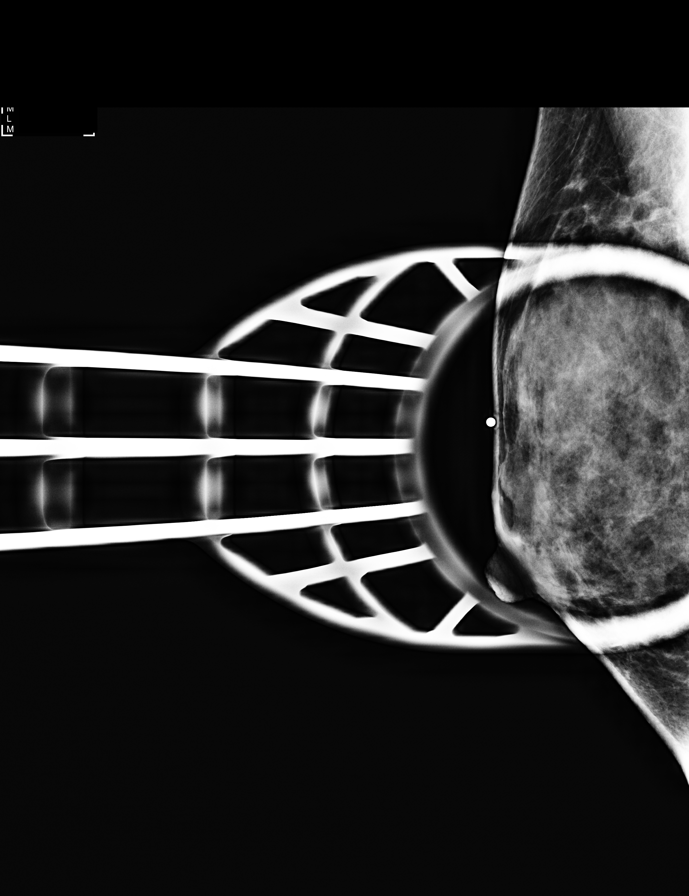

[L CC]
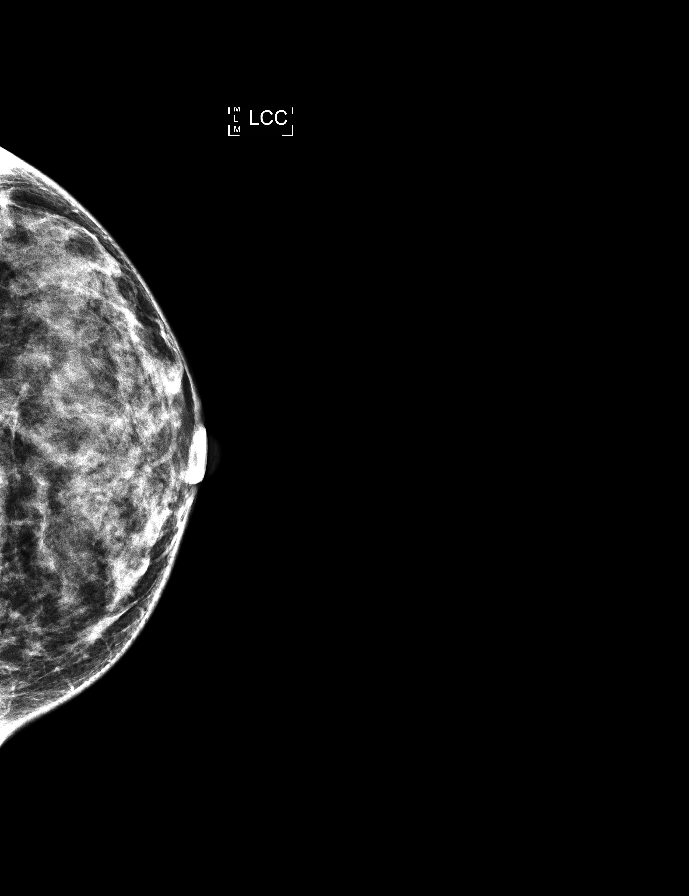

[R CC]
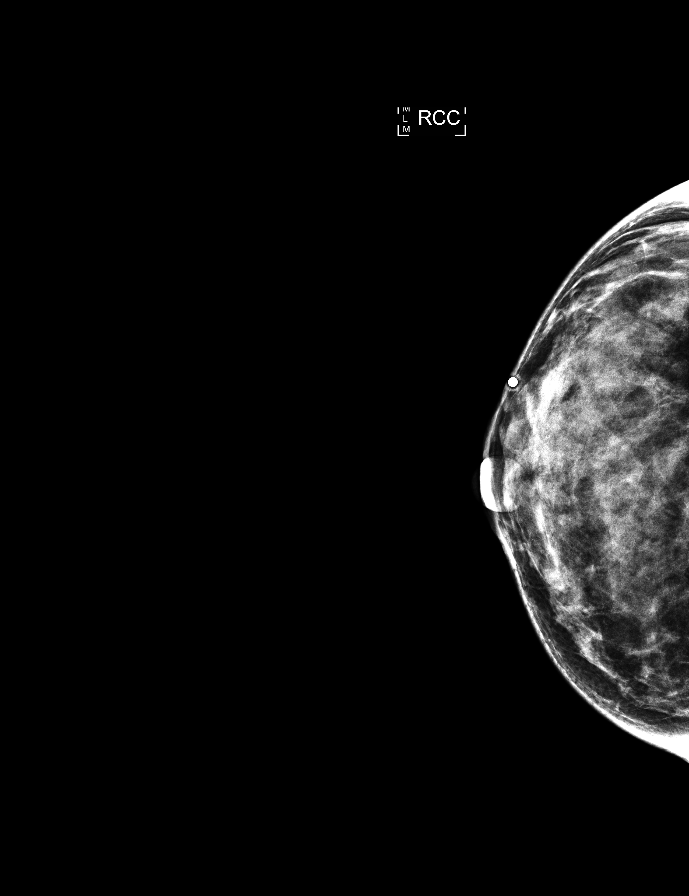

[L CC synth-2D]
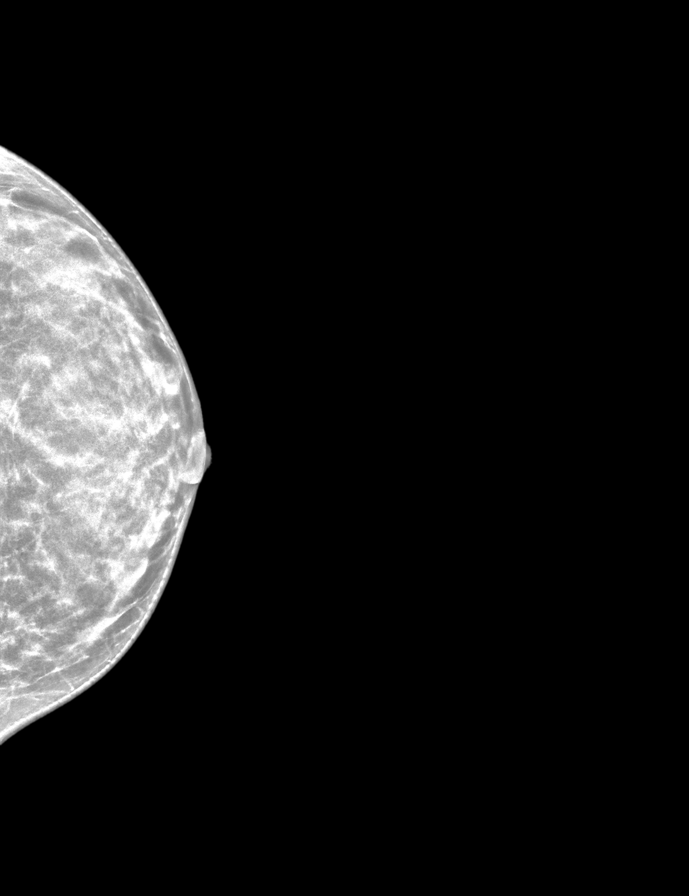

[8 of 35 positions shown; findings below may reference images not displayed]

ACR Breast Density Category d: The breast tissue is extremely dense,
which lowers the sensitivity of mammography.
FINDINGS: Bilateral 2D CC and MLO projections were obtained today, with
additional 3D tomosynthesis, and with additional spot compression
view of the upper right breast corresponding to the area of clinical
concern, with overlying skin marker in place.

There are no dominant masses, suspicious calcifications or secondary
signs of malignancy identified within either breast. Specifically,
no evidence of malignancy within the upper right breast
corresponding to the area of clinical concern.

Mammographic images were processed with CAD.

On physical exam, there is vague thickening within the upper right
breast without evidence of circumscribed mass.

Targeted ultrasound is performed, evaluating the upper right breast
with particular attention to the 11 o'clock axis as directed by the
patient, showing only normal fibroglandular tissues and fat lobules.
No suspicious solid or cystic mass is identified. There is a ridge
of normal dense fibroglandular tissue corresponding to the area of
clinical concern.
IMPRESSION: No evidence of malignancy within either breast. Ridge of normal
dense fibroglandular tissue within the upper right breast
corresponding to the area of clinical concern.

RECOMMENDATION:
1. Screening mammogram in one year.(Code:[id number]-P-ON6) - As patient's
mother was diagnosed with breast cancer at age 38, recommend
starting a routine annual screening mammogram schedule at this time.
2. Also, given patient's family history of breast cancer, would
consider the addition of annual screening breast MRI to annual
screening mammography. Per American Cancer Society guidelines,
annual screening MRI of the breasts is recommended if a risk
assessment calculation for breast cancer, preferably using the
Tyrer-Cuzick model, measures greater than 20%.

I have discussed the findings and recommendations with the patient.
Results were also provided in writing at the conclusion of the
visit. If applicable, a reminder letter will be sent to the patient
regarding the next appointment.

BI-RADS CATEGORY  1: Negative.

## 2019-02-16 DIAGNOSIS — R112 Nausea with vomiting, unspecified: Secondary | ICD-10-CM | POA: Diagnosis not present

## 2019-02-16 DIAGNOSIS — I10 Essential (primary) hypertension: Secondary | ICD-10-CM | POA: Diagnosis not present

## 2019-02-16 DIAGNOSIS — R52 Pain, unspecified: Secondary | ICD-10-CM | POA: Diagnosis not present

## 2019-02-16 DIAGNOSIS — R000 Tachycardia, unspecified: Secondary | ICD-10-CM | POA: Diagnosis not present

## 2019-02-16 DIAGNOSIS — R110 Nausea: Secondary | ICD-10-CM | POA: Diagnosis not present

## 2019-02-17 DIAGNOSIS — Z3202 Encounter for pregnancy test, result negative: Secondary | ICD-10-CM | POA: Diagnosis not present

## 2019-02-17 DIAGNOSIS — Z20822 Contact with and (suspected) exposure to covid-19: Secondary | ICD-10-CM | POA: Diagnosis not present

## 2019-02-17 DIAGNOSIS — R1084 Generalized abdominal pain: Secondary | ICD-10-CM | POA: Diagnosis not present

## 2019-02-17 DIAGNOSIS — R509 Fever, unspecified: Secondary | ICD-10-CM | POA: Diagnosis not present

## 2019-02-17 DIAGNOSIS — R160 Hepatomegaly, not elsewhere classified: Secondary | ICD-10-CM | POA: Diagnosis not present

## 2019-02-17 DIAGNOSIS — R112 Nausea with vomiting, unspecified: Secondary | ICD-10-CM | POA: Diagnosis not present

## 2019-02-18 DIAGNOSIS — I517 Cardiomegaly: Secondary | ICD-10-CM | POA: Diagnosis not present

## 2019-02-21 DIAGNOSIS — G43909 Migraine, unspecified, not intractable, without status migrainosus: Secondary | ICD-10-CM | POA: Diagnosis not present

## 2019-02-23 DIAGNOSIS — Z79899 Other long term (current) drug therapy: Secondary | ICD-10-CM | POA: Diagnosis not present

## 2019-03-16 DIAGNOSIS — Z09 Encounter for follow-up examination after completed treatment for conditions other than malignant neoplasm: Secondary | ICD-10-CM | POA: Diagnosis not present

## 2019-03-16 DIAGNOSIS — G933 Postviral fatigue syndrome: Secondary | ICD-10-CM | POA: Diagnosis not present

## 2019-03-16 DIAGNOSIS — R7301 Impaired fasting glucose: Secondary | ICD-10-CM | POA: Diagnosis not present

## 2019-03-16 DIAGNOSIS — R5383 Other fatigue: Secondary | ICD-10-CM | POA: Diagnosis not present

## 2019-04-03 DIAGNOSIS — E663 Overweight: Secondary | ICD-10-CM | POA: Diagnosis not present

## 2019-04-03 DIAGNOSIS — R945 Abnormal results of liver function studies: Secondary | ICD-10-CM | POA: Diagnosis not present

## 2019-04-03 DIAGNOSIS — D7589 Other specified diseases of blood and blood-forming organs: Secondary | ICD-10-CM | POA: Diagnosis not present

## 2019-04-06 DIAGNOSIS — Z79899 Other long term (current) drug therapy: Secondary | ICD-10-CM | POA: Diagnosis not present

## 2019-04-08 DIAGNOSIS — R945 Abnormal results of liver function studies: Secondary | ICD-10-CM | POA: Diagnosis not present

## 2019-04-08 DIAGNOSIS — R7301 Impaired fasting glucose: Secondary | ICD-10-CM | POA: Diagnosis not present

## 2019-04-08 DIAGNOSIS — K7689 Other specified diseases of liver: Secondary | ICD-10-CM | POA: Diagnosis not present

## 2019-04-08 DIAGNOSIS — D511 Vitamin B12 deficiency anemia due to selective vitamin B12 malabsorption with proteinuria: Secondary | ICD-10-CM | POA: Diagnosis not present

## 2019-04-14 DIAGNOSIS — D7589 Other specified diseases of blood and blood-forming organs: Secondary | ICD-10-CM | POA: Diagnosis not present

## 2019-04-14 DIAGNOSIS — K760 Fatty (change of) liver, not elsewhere classified: Secondary | ICD-10-CM | POA: Diagnosis not present

## 2019-04-14 DIAGNOSIS — D529 Folate deficiency anemia, unspecified: Secondary | ICD-10-CM | POA: Diagnosis not present

## 2019-04-14 DIAGNOSIS — G4700 Insomnia, unspecified: Secondary | ICD-10-CM | POA: Diagnosis not present

## 2019-04-28 DIAGNOSIS — M6250 Muscle wasting and atrophy, not elsewhere classified, unspecified site: Secondary | ICD-10-CM | POA: Diagnosis not present

## 2019-04-28 DIAGNOSIS — M797 Fibromyalgia: Secondary | ICD-10-CM | POA: Diagnosis not present

## 2019-04-28 DIAGNOSIS — M546 Pain in thoracic spine: Secondary | ICD-10-CM | POA: Diagnosis not present

## 2019-04-28 DIAGNOSIS — M533 Sacrococcygeal disorders, not elsewhere classified: Secondary | ICD-10-CM | POA: Diagnosis not present

## 2019-04-30 DIAGNOSIS — M2550 Pain in unspecified joint: Secondary | ICD-10-CM | POA: Diagnosis not present

## 2019-04-30 DIAGNOSIS — M546 Pain in thoracic spine: Secondary | ICD-10-CM | POA: Diagnosis not present

## 2019-04-30 DIAGNOSIS — M6250 Muscle wasting and atrophy, not elsewhere classified, unspecified site: Secondary | ICD-10-CM | POA: Diagnosis not present

## 2019-04-30 DIAGNOSIS — M797 Fibromyalgia: Secondary | ICD-10-CM | POA: Diagnosis not present

## 2019-05-04 DIAGNOSIS — M6250 Muscle wasting and atrophy, not elsewhere classified, unspecified site: Secondary | ICD-10-CM | POA: Diagnosis not present

## 2019-05-04 DIAGNOSIS — M2550 Pain in unspecified joint: Secondary | ICD-10-CM | POA: Diagnosis not present

## 2019-05-04 DIAGNOSIS — M797 Fibromyalgia: Secondary | ICD-10-CM | POA: Diagnosis not present

## 2019-05-04 DIAGNOSIS — M546 Pain in thoracic spine: Secondary | ICD-10-CM | POA: Diagnosis not present

## 2019-05-12 DIAGNOSIS — Z79899 Other long term (current) drug therapy: Secondary | ICD-10-CM | POA: Diagnosis not present

## 2019-05-12 DIAGNOSIS — Z20822 Contact with and (suspected) exposure to covid-19: Secondary | ICD-10-CM | POA: Diagnosis not present

## 2019-05-12 DIAGNOSIS — R945 Abnormal results of liver function studies: Secondary | ICD-10-CM | POA: Diagnosis not present

## 2019-05-12 DIAGNOSIS — K760 Fatty (change of) liver, not elsewhere classified: Secondary | ICD-10-CM | POA: Diagnosis not present

## 2019-05-12 DIAGNOSIS — N946 Dysmenorrhea, unspecified: Secondary | ICD-10-CM | POA: Diagnosis not present

## 2019-05-19 DIAGNOSIS — L989 Disorder of the skin and subcutaneous tissue, unspecified: Secondary | ICD-10-CM | POA: Diagnosis not present

## 2019-05-19 DIAGNOSIS — F17200 Nicotine dependence, unspecified, uncomplicated: Secondary | ICD-10-CM | POA: Diagnosis not present

## 2019-05-19 DIAGNOSIS — T8332XA Displacement of intrauterine contraceptive device, initial encounter: Secondary | ICD-10-CM | POA: Diagnosis not present

## 2019-05-19 DIAGNOSIS — Z3009 Encounter for other general counseling and advice on contraception: Secondary | ICD-10-CM | POA: Diagnosis not present

## 2019-05-20 DIAGNOSIS — M797 Fibromyalgia: Secondary | ICD-10-CM | POA: Diagnosis not present

## 2019-05-20 DIAGNOSIS — M2550 Pain in unspecified joint: Secondary | ICD-10-CM | POA: Diagnosis not present

## 2019-05-20 DIAGNOSIS — M6250 Muscle wasting and atrophy, not elsewhere classified, unspecified site: Secondary | ICD-10-CM | POA: Diagnosis not present

## 2019-05-20 DIAGNOSIS — M546 Pain in thoracic spine: Secondary | ICD-10-CM | POA: Diagnosis not present

## 2019-05-20 DIAGNOSIS — G894 Chronic pain syndrome: Secondary | ICD-10-CM | POA: Diagnosis not present

## 2019-05-20 DIAGNOSIS — Z09 Encounter for follow-up examination after completed treatment for conditions other than malignant neoplasm: Secondary | ICD-10-CM | POA: Diagnosis not present

## 2019-05-20 DIAGNOSIS — G933 Postviral fatigue syndrome: Secondary | ICD-10-CM | POA: Diagnosis not present

## 2019-05-20 DIAGNOSIS — F419 Anxiety disorder, unspecified: Secondary | ICD-10-CM | POA: Diagnosis not present

## 2019-05-25 ENCOUNTER — Other Ambulatory Visit: Payer: Self-pay | Admitting: Family Medicine

## 2019-05-25 DIAGNOSIS — Z1231 Encounter for screening mammogram for malignant neoplasm of breast: Secondary | ICD-10-CM

## 2019-05-27 DIAGNOSIS — M546 Pain in thoracic spine: Secondary | ICD-10-CM | POA: Diagnosis not present

## 2019-05-27 DIAGNOSIS — M797 Fibromyalgia: Secondary | ICD-10-CM | POA: Diagnosis not present

## 2019-05-27 DIAGNOSIS — M2550 Pain in unspecified joint: Secondary | ICD-10-CM | POA: Diagnosis not present

## 2019-05-27 DIAGNOSIS — M6250 Muscle wasting and atrophy, not elsewhere classified, unspecified site: Secondary | ICD-10-CM | POA: Diagnosis not present

## 2019-06-03 DIAGNOSIS — M6250 Muscle wasting and atrophy, not elsewhere classified, unspecified site: Secondary | ICD-10-CM | POA: Diagnosis not present

## 2019-06-03 DIAGNOSIS — M546 Pain in thoracic spine: Secondary | ICD-10-CM | POA: Diagnosis not present

## 2019-06-03 DIAGNOSIS — M2550 Pain in unspecified joint: Secondary | ICD-10-CM | POA: Diagnosis not present

## 2019-06-03 DIAGNOSIS — M797 Fibromyalgia: Secondary | ICD-10-CM | POA: Diagnosis not present

## 2019-06-05 DIAGNOSIS — M2550 Pain in unspecified joint: Secondary | ICD-10-CM | POA: Diagnosis not present

## 2019-06-05 DIAGNOSIS — M546 Pain in thoracic spine: Secondary | ICD-10-CM | POA: Diagnosis not present

## 2019-06-05 DIAGNOSIS — M797 Fibromyalgia: Secondary | ICD-10-CM | POA: Diagnosis not present

## 2019-06-05 DIAGNOSIS — M6250 Muscle wasting and atrophy, not elsewhere classified, unspecified site: Secondary | ICD-10-CM | POA: Diagnosis not present

## 2019-06-08 ENCOUNTER — Ambulatory Visit: Payer: Self-pay

## 2019-06-09 ENCOUNTER — Ambulatory Visit: Payer: Medicare Other

## 2019-06-10 ENCOUNTER — Ambulatory Visit: Payer: Medicare Other

## 2019-06-11 DIAGNOSIS — Z3200 Encounter for pregnancy test, result unknown: Secondary | ICD-10-CM | POA: Diagnosis not present

## 2019-06-11 DIAGNOSIS — Z79899 Other long term (current) drug therapy: Secondary | ICD-10-CM | POA: Diagnosis not present

## 2019-06-13 DIAGNOSIS — W208XXA Other cause of strike by thrown, projected or falling object, initial encounter: Secondary | ICD-10-CM | POA: Diagnosis not present

## 2019-06-13 DIAGNOSIS — S9031XA Contusion of right foot, initial encounter: Secondary | ICD-10-CM | POA: Diagnosis not present

## 2019-06-13 DIAGNOSIS — Y999 Unspecified external cause status: Secondary | ICD-10-CM | POA: Diagnosis not present

## 2019-06-13 DIAGNOSIS — S9781XA Crushing injury of right foot, initial encounter: Secondary | ICD-10-CM | POA: Diagnosis not present

## 2019-07-07 ENCOUNTER — Ambulatory Visit: Payer: Medicare Other

## 2019-09-04 DIAGNOSIS — D510 Vitamin B12 deficiency anemia due to intrinsic factor deficiency: Secondary | ICD-10-CM | POA: Diagnosis not present

## 2019-09-04 DIAGNOSIS — R5383 Other fatigue: Secondary | ICD-10-CM | POA: Diagnosis not present

## 2019-09-04 DIAGNOSIS — Z79891 Long term (current) use of opiate analgesic: Secondary | ICD-10-CM | POA: Diagnosis not present

## 2019-09-04 DIAGNOSIS — Z09 Encounter for follow-up examination after completed treatment for conditions other than malignant neoplasm: Secondary | ICD-10-CM | POA: Diagnosis not present

## 2019-09-04 DIAGNOSIS — D529 Folate deficiency anemia, unspecified: Secondary | ICD-10-CM | POA: Diagnosis not present

## 2019-09-04 DIAGNOSIS — G933 Postviral fatigue syndrome: Secondary | ICD-10-CM | POA: Diagnosis not present

## 2019-10-14 DIAGNOSIS — D529 Folate deficiency anemia, unspecified: Secondary | ICD-10-CM | POA: Diagnosis not present

## 2019-10-14 DIAGNOSIS — G933 Postviral fatigue syndrome: Secondary | ICD-10-CM | POA: Diagnosis not present

## 2019-10-14 DIAGNOSIS — D511 Vitamin B12 deficiency anemia due to selective vitamin B12 malabsorption with proteinuria: Secondary | ICD-10-CM | POA: Diagnosis not present

## 2019-10-14 DIAGNOSIS — Z09 Encounter for follow-up examination after completed treatment for conditions other than malignant neoplasm: Secondary | ICD-10-CM | POA: Diagnosis not present

## 2019-10-14 DIAGNOSIS — R5382 Chronic fatigue, unspecified: Secondary | ICD-10-CM | POA: Diagnosis not present

## 2019-12-23 ENCOUNTER — Other Ambulatory Visit: Payer: Self-pay | Admitting: Family Medicine

## 2019-12-23 DIAGNOSIS — N63 Unspecified lump in unspecified breast: Secondary | ICD-10-CM

## 2020-01-06 ENCOUNTER — Ambulatory Visit
Admission: RE | Admit: 2020-01-06 | Discharge: 2020-01-06 | Disposition: A | Discharge status: Home / Self Care | Payer: Medicare Other | Source: Ambulatory Visit | Attending: Family Medicine | Admitting: Family Medicine

## 2020-01-06 ENCOUNTER — Other Ambulatory Visit: Payer: Self-pay

## 2020-01-06 DIAGNOSIS — N63 Unspecified lump in unspecified breast: Secondary | ICD-10-CM

## 2020-12-14 ENCOUNTER — Other Ambulatory Visit: Payer: Self-pay

## 2020-12-14 ENCOUNTER — Encounter (HOSPITAL_COMMUNITY): Payer: Self-pay

## 2020-12-14 ENCOUNTER — Emergency Department (HOSPITAL_COMMUNITY)
Admission: EM | Admit: 2020-12-14 | Discharge: 2020-12-15 | Disposition: A | Discharge status: Home / Self Care | Payer: Medicare Other | Attending: Emergency Medicine | Admitting: Emergency Medicine

## 2020-12-14 DIAGNOSIS — S6992XA Unspecified injury of left wrist, hand and finger(s), initial encounter: Secondary | ICD-10-CM | POA: Insufficient documentation

## 2020-12-14 DIAGNOSIS — T7421XA Adult sexual abuse, confirmed, initial encounter: Secondary | ICD-10-CM | POA: Insufficient documentation

## 2020-12-14 DIAGNOSIS — X58XXXD Exposure to other specified factors, subsequent encounter: Secondary | ICD-10-CM | POA: Diagnosis not present

## 2020-12-14 DIAGNOSIS — X58XXXA Exposure to other specified factors, initial encounter: Secondary | ICD-10-CM | POA: Diagnosis not present

## 2020-12-14 DIAGNOSIS — Z87891 Personal history of nicotine dependence: Secondary | ICD-10-CM | POA: Insufficient documentation

## 2020-12-14 DIAGNOSIS — S60420D Blister (nonthermal) of right index finger, subsequent encounter: Secondary | ICD-10-CM | POA: Insufficient documentation

## 2020-12-14 DIAGNOSIS — I10 Essential (primary) hypertension: Secondary | ICD-10-CM | POA: Insufficient documentation

## 2020-12-14 DIAGNOSIS — Z5321 Procedure and treatment not carried out due to patient leaving prior to being seen by health care provider: Secondary | ICD-10-CM | POA: Insufficient documentation

## 2020-12-14 DIAGNOSIS — Z79899 Other long term (current) drug therapy: Secondary | ICD-10-CM | POA: Diagnosis not present

## 2020-12-14 HISTORY — DX: Bipolar disorder, unspecified: F31.9
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
## 2020-12-15 ENCOUNTER — Encounter (HOSPITAL_BASED_OUTPATIENT_CLINIC_OR_DEPARTMENT_OTHER): Payer: Self-pay | Admitting: Emergency Medicine

## 2020-12-15 ENCOUNTER — Other Ambulatory Visit: Payer: Self-pay

## 2020-12-15 ENCOUNTER — Emergency Department (HOSPITAL_BASED_OUTPATIENT_CLINIC_OR_DEPARTMENT_OTHER)
Admission: EM | Admit: 2020-12-15 | Discharge: 2020-12-16 | Disposition: A | Discharge status: Home / Self Care | Payer: Medicare Other | Source: Home / Self Care | Attending: Emergency Medicine | Admitting: Emergency Medicine

## 2020-12-15 DIAGNOSIS — X58XXXA Exposure to other specified factors, initial encounter: Secondary | ICD-10-CM | POA: Insufficient documentation

## 2020-12-15 DIAGNOSIS — S60421A Blister (nonthermal) of left index finger, initial encounter: Secondary | ICD-10-CM

## 2020-12-15 DIAGNOSIS — I10 Essential (primary) hypertension: Secondary | ICD-10-CM | POA: Insufficient documentation

## 2020-12-15 DIAGNOSIS — Z79899 Other long term (current) drug therapy: Secondary | ICD-10-CM | POA: Insufficient documentation

## 2020-12-15 DIAGNOSIS — T7421XA Adult sexual abuse, confirmed, initial encounter: Secondary | ICD-10-CM | POA: Diagnosis not present

## 2020-12-15 DIAGNOSIS — Z87891 Personal history of nicotine dependence: Secondary | ICD-10-CM | POA: Insufficient documentation

## 2020-12-15 DIAGNOSIS — T7621XA Adult sexual abuse, suspected, initial encounter: Secondary | ICD-10-CM | POA: Insufficient documentation

## 2020-12-15 HISTORY — DX: Opioid use, unspecified, uncomplicated: F11.90

## 2020-12-15 HISTORY — DX: Alcohol abuse, uncomplicated: F10.10

## 2020-12-15 HISTORY — DX: Personal history of other specified conditions: Z87.898
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
## 2020-12-15 NOTE — ED Notes (Signed)
LWBS 
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
## 2020-12-15 NOTE — ED Notes (Signed)
SANE nurse here to evaluate pt.

## 2020-12-16 ENCOUNTER — Encounter (HOSPITAL_BASED_OUTPATIENT_CLINIC_OR_DEPARTMENT_OTHER): Payer: Self-pay | Admitting: Emergency Medicine

## 2020-12-16 ENCOUNTER — Emergency Department (HOSPITAL_BASED_OUTPATIENT_CLINIC_OR_DEPARTMENT_OTHER)
Admission: EM | Admit: 2020-12-16 | Discharge: 2020-12-16 | Disposition: A | Discharge status: Home / Self Care | Payer: Medicare Other | Source: Home / Self Care | Attending: Emergency Medicine | Admitting: Emergency Medicine

## 2020-12-16 DIAGNOSIS — X58XXXD Exposure to other specified factors, subsequent encounter: Secondary | ICD-10-CM | POA: Insufficient documentation

## 2020-12-16 DIAGNOSIS — S60420D Blister (nonthermal) of right index finger, subsequent encounter: Secondary | ICD-10-CM | POA: Insufficient documentation

## 2020-12-16 DIAGNOSIS — Z79899 Other long term (current) drug therapy: Secondary | ICD-10-CM | POA: Insufficient documentation

## 2020-12-16 DIAGNOSIS — Z87891 Personal history of nicotine dependence: Secondary | ICD-10-CM | POA: Insufficient documentation

## 2020-12-16 DIAGNOSIS — I10 Essential (primary) hypertension: Secondary | ICD-10-CM | POA: Insufficient documentation

## 2020-12-16 DIAGNOSIS — S60429D Blister (nonthermal) of unspecified finger, subsequent encounter: Secondary | ICD-10-CM

## 2020-12-16 DIAGNOSIS — T7421XA Adult sexual abuse, confirmed, initial encounter: Secondary | ICD-10-CM | POA: Diagnosis not present

## 2020-12-16 MED ORDER — AMOXICILLIN-POT CLAVULANATE 875-125 MG PO TABS: 1.0000 | ORAL_TABLET | Freq: Two times a day (BID) | ORAL | 0 refills | Status: AC

## 2020-12-16 MED ORDER — AMOXICILLIN-POT CLAVULANATE 875-125 MG PO TABS
1.0000 | ORAL_TABLET | Freq: Once | ORAL | Status: AC
  Administered 2020-12-16: 1 via ORAL
  Filled 2020-12-16: qty 1

## 2020-12-16 NOTE — ED Provider Notes (Signed)
Plan at signout was for patient to see the SANE nurse for evidence collection. Patient has declined evidence collection. Patient is being seen by social worker to to arrange outpatient alcohol use disorder treatment. Patient request evaluation for pain and blister to her left index finger.  She appears to have a blister and hematoma to the left index finger.  There is some mild erythema.  She has full flexion extension of the finger.  Patient already had an x-ray of this done in an outside hospital that was negative. She is not sure how it was injured.  We will place her on Augmentin twice a day for a week   Ripley Fraise, MD 12/16/20 [id number]

## 2020-12-16 NOTE — ED Triage Notes (Signed)
Pt reports  left index finger is looking and feeling worse since being seen earlier. Blister and bruising noted.

## 2020-12-16 NOTE — Discharge Instructions (Signed)
Substance Abuse Treatment Programs ° °Intensive Outpatient Programs °[facility]     °[address]      °[place], [place]                   °[phone]      ° °[facility] °[address] °[place], [place] °[phone] ° °[facility]     °(Inpatient and outpatient)     °[address]           °[phone]   ° °[facility] °[phone] (Suboxone and Methadone) ° °[address]      °[place], [place] [address]      °[phone]      ° °[address] °[place], [place] °[phone] ° °[facility] (Outpatient/Inpatient, [facility])    °(insurance only) [phone]      °       °[facility]Groups & Residential) °[place], [place] °[phone] ° °   °[facility]     °[address]     °[place], [place]      °[phone]      ° °[facility] (for caregivers and family) °[address] °[place], [place] °336-299-4655 ° ° ° ° ° °Residential Treatment Programs °[facility]      °[address], [place], [place] [address]  °[phone]      ° °[facility] °[address] [place], [place] [address] °[phone] ° °[facility]        °[phone]      ° °[facility] °[phone] ° °[facility]             °[address]                                         °[place], [place]                                                °[phone] or [phone]                              ° °[facility] [place] °[address] °[place], [address] °[phone] ° °[facility]    °[address]      °[place], [place]     °[phone]      ° °[facility] °[address] °[place], [place] °[phone] ° °[facility]   °[address]     °[place], [place] [address]     °[phone]      °Admissions: 8am-3pm M-F ° °[facility] °[address] °[place],  [place] °[phone] ° °[facility]: Residential Program (90 Days)   °[place], [place]      °336-725-8389 or [phone]    ° °[facility] °[place], [place] °(Walk in Hours over the weekend or by referral) ° °[facility] °[address], [place], [place] [address] °([phone] ° °Crisis Mobile: [facility]:  1-877-626-1772 (for crisis response 24 hours a day) °[facility] Hotline:      1-800-256-2452 °Outpatient Psychiatry and Counseling ° °[facility]: Mobile Crisis   Management 24 hours:  1-877-626-1772 ° °[facility] sliding scale fee and walk in schedule: M-F 8am-12pm/1pm-3pm °[address]  °[place], [place] [address] °[phone] ° °[facility] °[address] °[place], [place] [address] °[phone] ° °[facility] ([facility])- new patient walk-in appointments available Monday - Friday 8am -3pm.          °[address] °[place], [place] [address] °336-676-6840 or crisis line- 336-676-6905 ° °[facility]/ Intensive Outpatient Therapy Program °[address] °[place], [place] [address] °[phone] ° °[facility]                  °Crisis Services      °[phone]      °[address]     °[place], [place] [address]                ° °[facility]   °[facility] °[phone] °[address] °[place], [place] [address] ° ° °[facility]          °[address],  °[place], [place] [address]       °[phone] ° °[facility] °[address] °[place], [place] [address] °336-292-1510 ° °[facility]    °[address]    °[place], [place] [address]     °[phone]      ° °Parish McKinney, MD     °[address]     °[place] [address]     °[phone]     °  °[facility] °[address] °[place] [address] ° °[facility]     °[address]     °[place], [place]      °336-542-2076      ° °[facility] °Shamsher Ahluwalia, MD °[address] °[place], [place] [address] °[phone] ° °[facility]     °[address]     °[place], [place] [address]     °336-274-1237      ° °[facility] °[address] °[place], [place] [address] °336-854-4450 °Resources for Temporary Residential Assistance/Crisis Centers ° °DAY CENTERS °[facility] °M-F 8am-3pm   °[address] [place], [place] [address]   336-332-0824 °Services include: laundry, barbering, support groups, case management, phone  & computer access, showers, AA/NA mtgs, mental health/substance abuse nurse, job skills class, disability information, VA assistance, spiritual classes, etc.  ° °HOMELESS SHELTERS ° °[facility]     °[facility]   °[address], [place]     °[phone]       °       °[facility] (women and children)       °[address] °[place], [place] [address] °336-275-0820 °[email or web address] for application and process °Application Required ° °[facility]   °[address]    °[place] [address]     °[phone]       °             °[facility] °[address] °,  [address] °[phone] °336-235-0363(schedule application appt.) °Application Required ° °[facility] (women only)    °[address]     °[place],  [address]     °336-884-1039      °  Intake starts 6pm daily °Need valid ID, SSC, & Police report °[facility] [place] °[address] °[place], [place] °336-881-5420 °Application Required ° °[facility] (men only)     °[address]      °[place], [place]     °[phone]      ° °[facility] °(Pregnant women only) °[address] °[place], [place] °336-275-0206 ° °[facility]      °[address]      °[place], [place] [address]     °[phone]      °       °[facility] °[address] °[place], [place] °336-723-1848 °90 day commitment/SA/Application process ° °[facility](men only)     °[address]     °[place], [place]     °[phone]       °Check-in at 7pm     °       °[facility] °[address] °[place], [place] [address] °336-248-6684 °Men/Women/Women and Children must be there by 7 pm ° °[facility] °[place], [place] °336-722-8721                ° °

## 2020-12-16 NOTE — ED Provider Notes (Signed)
[facility] Provider Note   CSN: [id number] Arrival date & time: 12/16/20  0111     History Chief Complaint  Patient presents with   Finger Injury    Madison Ryan is a 35 y.o. female.  The history is provided by the patient.  Patient returns after just being discharged left index finger looked at again.  No new injuries.  She reports a blister appeared several days ago & she is unsure how she injured the finger    Past Medical History:  Diagnosis Date   Alcohol abuse    Bipolar disorder ([place])    Depression    anxiety   Fibromyalgia    GERD (gastroesophageal reflux disease)    History of benzodiazepine use    Hypertension    Migraines    Opiate use    Recurrent urticaria 04/05/2016   Tachycardia     Patient Active Problem List   Diagnosis Date Noted   Recurrent urticaria 04/05/2016   Dyspnea 04/05/2016   Other allergic rhinitis 04/05/2016    Past Surgical History:  Procedure Laterality Date   CYSTECTOMY     LAPAROSCOPY     MOLE REMOVAL     TYMPANOSTOMY TUBE PLACEMENT       OB History   No obstetric history on file.     Family History  Problem Relation Age of Onset   Hypertension Mother    Heart disease Mother    COPD Mother    Liver disease Mother    Congestive Heart Failure Mother    Breast cancer Mother 32   Hyperthyroidism Sister    Hypertension Maternal Grandmother    Heart disease Maternal Grandmother    COPD Maternal Grandmother     Social History   Tobacco Use   Smoking status: Former    Packs/day: 1.00    Years: 12.00    Pack years: 12.00    Types: Cigarettes    Quit date: 05/09/2018    Years since quitting: 2.6   Smokeless tobacco: Never  Substance Use Topics   Alcohol use: Yes    Comment: 1 month maybe   Drug use: No    Home Medications Prior to Admission medications   Medication Sig Start Date End Date Taking? Authorizing Provider  albuterol (PROAIR HFA) 108 (90 Base) MCG/ACT inhaler  INHALE 2 PUFFS 4 TIMES DAILY 09/17/15   [provider]  amoxicillin-clavulanate (AUGMENTIN) 875-125 MG tablet Take 1 tablet by mouth every 12 (twelve) hours. 12/16/20   Ripley Fraise, MD  Buprenorphine HCl-Naloxone HCl 8-2 MG FILM 3 (three) times daily. 03/09/18   [provider]  Calcium Carb-Ergocalciferol 500-200 MG-UNIT TABS Take by mouth.    [provider]  clonazePAM (KLONOPIN) 1 MG tablet as needed. 2 x day 02/27/18   [provider]  desvenlafaxine (PRISTIQ) 50 MG 24 hr tablet Take 50 mg by mouth. Reported on 05/03/2015 04/28/15 07/09/18  [provider]  fluticasone (FLONASE) 50 MCG/ACT nasal spray Place 2 sprays into both nostrils daily. 04/05/16   Bobbitt, Sedalia Muta, MD  hydrOXYzine (ATARAX/VISTARIL) 25 MG tablet Take 25 mg by mouth as needed. 07/25/16   [provider]  mometasone (NASONEX) 50 MCG/ACT nasal spray USE 2 SPRAY IN EACH NOSTRIL ONCE DAILY 10/14/14   [provider]  montelukast (SINGULAIR) 10 MG tablet Take 10 mg by mouth. 09/11/15   [provider]  norgestrel-ethinyl estradiol (LO/OVRAL,CRYSELLE) 0.3-30 MG-MCG tablet Take by mouth. 12/29/14   [provider]  pantoprazole (PROTONIX) 20 MG tablet 20 mg daily. 03/06/18   [provider]  Potassium 99 MG TABS Take 99 mg by mouth.    [provider]  pregabalin (LYRICA) 200 MG capsule Take 200 mg by mouth 3 (three) times daily. 10/14/14   [provider]  progesterone (PROMETRIUM) 100 MG capsule Take 100 mg by mouth. 10/20/15 10/19/16  [provider]  propranolol (INDERAL) 20 MG tablet Take 20 mg by mouth. Takes 10 mg twice daily 11/04/14   [provider]  PROVENTIL HFA 108 (90 Base) MCG/ACT inhaler INHALE 2 PUFFS INTO THE LUNGS EVERY 6 (SIX) HOURS AS NEEDED FOR WHEEZING OR SHORTNESS OF BREATH. 06/12/16   Bobbitt, Sedalia Muta, MD  ranitidine (ZANTAC) 150 MG capsule Take 1 capsule (150 mg total) by mouth 2 (two)  times daily. 04/10/16   Bobbitt, Sedalia Muta, MD  Riboflavin 400 MG TABS Take by mouth. 12/03/14   [provider]  rizatriptan (MAXALT-MLT) 10 MG disintegrating tablet TAKE 1 TABLET (10 MG TOTAL) BY MOUTH AS NEEDED FOR MIGRAINE. MAY REPEAT IN 2 HOURS IF NEEDED 01/07/18   Penumalli, Earlean Polka, MD  sucralfate (CARAFATE) 1 g tablet Take 1 g by mouth 2 (two) times daily.    [provider]  tiZANidine (ZANAFLEX) 4 MG tablet daily. 03/04/18   [provider]  TOVIAZ 4 MG TB24 tablet daily. 02/11/18   [provider]    Allergies    Cymbalta [duloxetine hcl] and Prozac [fluoxetine hcl]  Review of Systems   Review of Systems  Skin:  Positive for wound.   Physical Exam Updated Vital Signs Temp 97.9 F (36.6 C) (Oral)   Ht 1.524 m (5')   Wt 55.8 kg   BMI 24.03 kg/m   Physical Exam CONSTITUTIONAL: Well developed/well nourished HEAD: Normocephalic/atraumatic EYES: EOMI ENMT: Mucous membranes moist NECK: supple no meningeal signs LUNGS:   no apparent distress NEURO: Pt is awake/alert/appropriate, moves all extremitiesx4.  She is ambulatory  EXTREMITIES: pulses normal/equal, full ROM The blister is soft.  No drainage/crepitus.  See photo SKIN: warm, see photo        ED Results / Procedures / Treatments   Labs (all labs ordered are listed, but only abnormal results are displayed) Labs Reviewed - No data to display  EKG None  Radiology No results found.  Procedures Procedures   Medications Ordered in ED Medications - No data to display  ED Course  I have reviewed the triage vital signs and the nursing notes.      MDM Rules/Calculators/A&P                           This blister appears the exact same as it did approximately 1 hour ago when she was in the ER.  Advised to continue her Augmentin and she can follow-up with hand surgery She has already had imaging of her hand at an outside hospital that was negative Final Clinical  Impression(s) / ED Diagnoses Final diagnoses:  Blister of finger, subsequent encounter    Rx / DC Orders ED Discharge Orders     None        Ripley Fraise, MD 12/16/20 [phone]
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
## 2021-02-27 ENCOUNTER — Other Ambulatory Visit: Payer: Self-pay | Admitting: Physician Assistant

## 2021-02-27 DIAGNOSIS — Z1231 Encounter for screening mammogram for malignant neoplasm of breast: Secondary | ICD-10-CM

## 2021-03-13 ENCOUNTER — Ambulatory Visit: Payer: Medicare Other
